# Patient Record
Sex: Female | Born: 1990
Health system: Southern US, Community
[De-identification: ages and names within clinical notes are randomized; demographics above are authoritative.]

## PROBLEM LIST (undated history)

## (undated) DIAGNOSIS — F419 Anxiety disorder, unspecified: Secondary | ICD-10-CM

## (undated) DIAGNOSIS — F909 Attention-deficit hyperactivity disorder, unspecified type: Secondary | ICD-10-CM

## (undated) DIAGNOSIS — Z9889 Other specified postprocedural states: Secondary | ICD-10-CM

## (undated) DIAGNOSIS — F32A Depression, unspecified: Secondary | ICD-10-CM

## (undated) DIAGNOSIS — R87629 Unspecified abnormal cytological findings in specimens from vagina: Secondary | ICD-10-CM

## (undated) HISTORY — DX: Unspecified abnormal cytological findings in specimens from vagina: R87.629

## (undated) HISTORY — PX: CRYOTHERAPY: SHX1416

## (undated) HISTORY — PX: DILATION AND CURETTAGE OF UTERUS: SHX78

---

## 2016-07-20 DIAGNOSIS — O42919 Preterm premature rupture of membranes, unspecified as to length of time between rupture and onset of labor, unspecified trimester: Secondary | ICD-10-CM

## 2017-01-01 DIAGNOSIS — Q999 Chromosomal abnormality, unspecified: Secondary | ICD-10-CM

## 2020-02-08 ENCOUNTER — Other Ambulatory Visit: Payer: Self-pay

## 2020-02-08 ENCOUNTER — Encounter: Payer: Self-pay | Admitting: Psychiatry

## 2020-02-08 ENCOUNTER — Ambulatory Visit (INDEPENDENT_AMBULATORY_CARE_PROVIDER_SITE_OTHER): Payer: 59 | Admitting: Psychiatry

## 2020-02-08 DIAGNOSIS — F900 Attention-deficit hyperactivity disorder, predominantly inattentive type: Secondary | ICD-10-CM | POA: Diagnosis not present

## 2020-02-08 DIAGNOSIS — F411 Generalized anxiety disorder: Secondary | ICD-10-CM | POA: Insufficient documentation

## 2020-02-08 MED ORDER — AMPHETAMINE-DEXTROAMPHET ER 20 MG PO CP24: 20.0000 mg | ORAL_CAPSULE | ORAL | 0 refills | Status: DC

## 2020-02-08 MED ORDER — SERTRALINE HCL 50 MG PO TABS: 50.0000 mg | ORAL_TABLET | Freq: Every day | ORAL | 1 refills | Status: DC
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
## 2020-03-08 ENCOUNTER — Telehealth (INDEPENDENT_AMBULATORY_CARE_PROVIDER_SITE_OTHER): Payer: 59 | Admitting: Psychiatry

## 2020-03-08 ENCOUNTER — Encounter: Payer: Self-pay | Admitting: Psychiatry

## 2020-03-08 ENCOUNTER — Other Ambulatory Visit: Payer: Self-pay

## 2020-03-08 DIAGNOSIS — F411 Generalized anxiety disorder: Secondary | ICD-10-CM | POA: Diagnosis not present

## 2020-03-08 DIAGNOSIS — F900 Attention-deficit hyperactivity disorder, predominantly inattentive type: Secondary | ICD-10-CM | POA: Diagnosis not present

## 2020-03-08 MED ORDER — AMPHETAMINE-DEXTROAMPHET ER 20 MG PO CP24: 20.0000 mg | ORAL_CAPSULE | Freq: Every morning | ORAL | 0 refills | Status: DC

## 2020-03-08 MED ORDER — SERTRALINE HCL 50 MG PO TABS: 50.0000 mg | ORAL_TABLET | Freq: Every day | ORAL | 1 refills | Status: DC

## 2020-03-08 MED ORDER — AMPHETAMINE-DEXTROAMPHET ER 20 MG PO CP24: 20.0000 mg | ORAL_CAPSULE | ORAL | 0 refills | Status: DC

## 2020-03-08 NOTE — Progress Notes (Signed)
BH MD/PA/NP OP Progress Note  I connected with  Ruth Ellis on 03/08/20 by a video enabled telemedicine application and verified that I am speaking with the correct person using two identifiers.   I discussed the limitations of evaluation and management by telemedicine. The patient expressed understanding and agreed to proceed.    03/08/2020 11:00 AM Ruth Ellis  MRN:  [id number]  Chief Complaint:  " I am doing a lot better."  HPI: Patient reported that she has noticed significant difference after she started taking Zoloft.  She stated that her mood is improved.  She is not worrying excessively.  She is not overthinking about things.  She has also noticed improvement in her sleep quality.  She stated that with the help of Adderall she is able to focus on one thing at a time and is able to accomplish a lot of work.  She stated that she started seeing a therapist recently and seeing them 2 times per month and that has been very helpful to.  She also informed that her relationship with her husband has improved and are communicating better.  She has made some changes in her daily routine and also has set boundaries with her mother and that has also helped her think clearly and focus on herself.  She did mention that her mother needed amputation of 2 more toes due to diabetes related complications. She did mention a few instances that happened when she was 67 or 29 years old and stated that she wanted to just bring them up so that she can get some clarity.  Visit Diagnosis:    ICD-10-CM   1. Generalized anxiety disorder  F41.1   2. Attention deficit hyperactivity disorder (ADHD), predominantly inattentive type  F90.0     Past Psychiatric History: Adjustment disorder with anxiety, ADHD  Past Medical History: No past medical history on file. No past surgical history on file.  Family Psychiatric History: History of schizophrenia, bipolar disorder, Alzheimer's disease in the family.  Family  History: No family history on file.  Social History:  Social History   Socioeconomic History  . Marital status: Married    Spouse name: Not on file  . Number of children: Not on file  . Years of education: Not on file  . Highest education level: Not on file  Occupational History  . Not on file  Tobacco Use  . Smoking status: Never Smoker  . Smokeless tobacco: Never Used  Substance and Sexual Activity  . Alcohol use: Not Currently  . Drug use: Never  . Sexual activity: Yes  Other Topics Concern  . Not on file  Social History Narrative  . Not on file   Social Determinants of Health   Financial Resource Strain:   . Difficulty of Paying Living Expenses:   Food Insecurity:   . Worried About Charity fundraiser in the Last Year:   . Arboriculturist in the Last Year:   Transportation Needs:   . Film/video editor (Medical):   Marland Kitchen Lack of Transportation (Non-Medical):   Physical Activity:   . Days of Exercise per Week:   . Minutes of Exercise per Session:   Stress:   . Feeling of Stress :   Social Connections:   . Frequency of Communication with Friends and Family:   . Frequency of Social Gatherings with Friends and Family:   . Attends Religious Services:   . Active Member of Clubs or Organizations:   . Attends Club  or Organization Meetings:   Marland Kitchen Marital Status:     Allergies: Not on File  Metabolic Disorder Labs: No results found for: HGBA1C, MPG No results found for: PROLACTIN No results found for: CHOL, TRIG, HDL, CHOLHDL, VLDL, LDLCALC No results found for: TSH  Therapeutic Level Labs: No results found for: LITHIUM No results found for: VALPROATE No components found for:  CBMZ  Current Medications: Current Outpatient Medications  Medication Sig Dispense Refill  . amphetamine-dextroamphetamine (ADDERALL XR) 20 MG 24 hr capsule Take 1 capsule (20 mg total) by mouth every morning. 30 capsule 0  . sertraline (ZOLOFT) 50 MG tablet Take 1 tablet (50 mg total)  by mouth daily. 30 tablet 1   No current facility-administered medications for this visit.       Psychiatric Specialty Exam: Review of Systems  There were no vitals taken for this visit.There is no height or weight on file to calculate BMI.  General Appearance: Well Groomed  Eye Contact:  Good  Speech:  Clear and Coherent and Normal Rate  Volume:  Normal  Mood:  Euthymic  Affect:  Congruent  Thought Process:  Goal Directed, Linear and Descriptions of Associations: Intact  Orientation:  Full (Time, Place, and Person)  Thought Content: Logical   Suicidal Thoughts:  No  Homicidal Thoughts:  No  Memory:  Recent;   Good Remote;   Good  Judgement: Fair  Insight: Fair  Psychomotor Activity:  Normal  Concentration:  Concentration: Good and Attention Span: Good  Recall:  Good  Fund of Knowledge: Good  Language: Good  Akathisia:  Negative  Handed:  Right  AIMS (if indicated): not done  Assets:  Communication Skills Desire for Improvement Financial Resources/Insurance Housing  ADL's:  Intact  Cognition: WNL  Sleep:  Good, improved    Assessment and Plan: Patient appears to be doing better after starting Zoloft and Adderall XR.  Patient denied any side effects.  She is agreeable to continue same regimen.  1. Generalized anxiety disorder  - sertraline (ZOLOFT) 50 MG tablet; Take 1 tablet (50 mg total) by mouth daily.  Dispense: 30 tablet; Refill: 1  2. Attention deficit hyperactivity disorder (ADHD), predominantly inattentive type  - amphetamine-dextroamphetamine (ADDERALL XR) 20 MG 24 hr capsule; Take 1 capsule (20 mg total) by mouth every morning.  Dispense: 30 capsule; Refill: 0 - amphetamine-dextroamphetamine (ADDERALL XR) 20 MG 24 hr capsule; Take 1 capsule (20 mg total) by mouth in the morning.  Dispense: 30 capsule; Refill: 0  Continue same regimen. Continue individual therapy. Follow-up in 2 months.  Zena Amos, MD 03/08/2020, 11:00 AM

## 2020-05-03 ENCOUNTER — Encounter (HOSPITAL_COMMUNITY): Payer: Self-pay | Admitting: Psychiatry

## 2020-05-03 ENCOUNTER — Telehealth (INDEPENDENT_AMBULATORY_CARE_PROVIDER_SITE_OTHER): Payer: 59 | Admitting: Psychiatry

## 2020-05-03 ENCOUNTER — Other Ambulatory Visit: Payer: Self-pay

## 2020-05-03 ENCOUNTER — Telehealth: Payer: 59 | Admitting: Psychiatry

## 2020-05-03 DIAGNOSIS — Z634 Disappearance and death of family member: Secondary | ICD-10-CM | POA: Diagnosis not present

## 2020-05-03 DIAGNOSIS — F900 Attention-deficit hyperactivity disorder, predominantly inattentive type: Secondary | ICD-10-CM | POA: Diagnosis not present

## 2020-05-03 DIAGNOSIS — F411 Generalized anxiety disorder: Secondary | ICD-10-CM | POA: Diagnosis not present

## 2020-05-03 MED ORDER — AMPHETAMINE-DEXTROAMPHET ER 20 MG PO CP24: 20.0000 mg | ORAL_CAPSULE | Freq: Every morning | ORAL | 0 refills | Status: DC

## 2020-05-03 MED ORDER — AMPHETAMINE-DEXTROAMPHET ER 20 MG PO CP24: 20.0000 mg | ORAL_CAPSULE | ORAL | 0 refills | Status: DC

## 2020-05-03 MED ORDER — SERTRALINE HCL 100 MG PO TABS: 100.0000 mg | ORAL_TABLET | Freq: Every day | ORAL | 2 refills | Status: DC

## 2020-05-03 NOTE — Progress Notes (Signed)
BH MD/PA/NP OP Progress Note  Virtual Visit via Video Note  I connected with Ruth Ellis on 05/03/20 at 10:30 AM EDT by a video enabled telemedicine application and verified that I am speaking with the correct person using two identifiers.  Location: Patient: Home Provider: Clinic   I discussed the limitations of evaluation and management by telemedicine and the availability of in person appointments. The patient expressed understanding and agreed to proceed.  I provided 18 minutes of non-face-to-face time during this encounter.    05/03/2020 10:49 AM Ruth Ellis  MRN:  [id number]  Chief Complaint:  " I lost my mother last week, I am leaving for [place] soon for the funeral arrangements."  HPI: Patient informed that she lost her mother last week.  She informed that her mother had an MI in [place] and died all of a sudden.  Patient stated that prior to losing her mother she was doing really well in terms of her mental and physical health.  She had been working out regularly and had lost weight.  She had started building up a new friend base and was outgoing.  She was working on fitness and had picked up new interests and hobbies.  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 She informed that she had even mended her differences with her mother a few weeks before she passed away.  She stated that they had started to communicate better.  She informed only that was not going well was her relationship with her husband as her husband is showing all the active signs of depression that she herself had dealt with.  She stated that her husband does not want to get any medications to help with that therefore she had encouraged him to see a therapist.  He missed his first evaluation with the therapist due to side effects to the second dose of COVID-19 vaccination.  However ever since her mother  passed away her husband has stepped up and has been very supportive of her.  He is very helpful now and is doing everything he can to help the patient.  Patient stated that she is grieving the loss and has been feeling very depressed and irritable.  She requested for an increase in her sertraline.  She stated that she has been sleeping excessively as a way to cope because she does not have any energy to do anything else.  She was agreeable to going up on the dose of sertraline to 100 mg daily.  She denied any suicidal ideations.   Visit Diagnosis:    ICD-10-CM   1. Generalized anxiety disorder  F41.1   2. Attention deficit hyperactivity disorder (ADHD), predominantly inattentive type  F90.0   3. Bereavement  Z63.4     Past Psychiatric History: Adjustment disorder with anxiety, ADHD  Past Medical History: No past medical history on file. No past surgical history on file.  Family Psychiatric History: History of schizophrenia, bipolar disorder, Alzheimer's disease in the family.  Family History: No family history on file.  Social History:  Social History   Socioeconomic History  . Marital status: Married    Spouse name: Not on file  . Number of children: Not on file  . Years of education: Not on file  . Highest education level: Not on file  Occupational History  . Not on file  Tobacco Use  . Smoking status: Never  Smoker  . Smokeless tobacco: Never Used  Substance and Sexual Activity  . Alcohol use: Not Currently  . Drug use: Never  . Sexual activity: Yes  Other Topics Concern  . Not on file  Social History Narrative  . Not on file   Social Determinants of Health   Financial Resource Strain:   . Difficulty of Paying Living Expenses:   Food Insecurity:   . Worried About Programme researcher, broadcasting/film/video in the Last Year:   . Barista in the Last Year:   Transportation Needs:   . Freight forwarder (Medical):   Marland Kitchen Lack of Transportation (Non-Medical):   Physical Activity:    . Days of Exercise per Week:   . Minutes of Exercise per Session:   Stress:   . Feeling of Stress :   Social Connections:   . Frequency of Communication with Friends and Family:   . Frequency of Social Gatherings with Friends and Family:   . Attends Religious Services:   . Active Member of Clubs or Organizations:   . Attends Banker Meetings:   Marland Kitchen Marital Status:     Allergies: Not on File  Metabolic Disorder Labs: No results found for: HGBA1C, MPG No results found for: PROLACTIN No results found for: CHOL, TRIG, HDL, CHOLHDL, VLDL, LDLCALC No results found for: TSH  Therapeutic Level Labs: No results found for: LITHIUM No results found for: VALPROATE No components found for:  CBMZ  Current Medications: Current Outpatient Medications  Medication Sig Dispense Refill  . amphetamine-dextroamphetamine (ADDERALL XR) 20 MG 24 hr capsule Take 1 capsule (20 mg total) by mouth every morning. 30 capsule 0  . amphetamine-dextroamphetamine (ADDERALL XR) 20 MG 24 hr capsule Take 1 capsule (20 mg total) by mouth in the morning. 30 capsule 0  . sertraline (ZOLOFT) 50 MG tablet Take 1 tablet (50 mg total) by mouth daily. 30 tablet 1   No current facility-administered medications for this visit.       Psychiatric Specialty Exam: Review of Systems  There were no vitals taken for this visit.There is no height or weight on file to calculate BMI.  General Appearance: Well Groomed  Eye Contact:  Good  Speech:  Clear and Coherent and Normal Rate  Volume:  Normal  Mood: Sad, irritable  Affect: Sad  Thought Process:  Goal Directed, Linear and Descriptions of Associations: Intact  Orientation:  Full (Time, Place, and Person)  Thought Content: Logical   Suicidal Thoughts:  No  Homicidal Thoughts:  No  Memory:  Recent;   Good Remote;   Good  Judgement: Fair  Insight: Fair  Psychomotor Activity:  Normal  Concentration:  Concentration: Good and Attention Span: Good   Recall:  Good  Fund of Knowledge: Good  Language: Good  Akathisia:  Negative  Handed:  Right  AIMS (if indicated): not done  Assets:  Communication Skills Desire for Improvement Financial Resources/Insurance Housing  ADL's:  Intact  Cognition: WNL  Sleep:  Good, improved    Assessment and Plan: Patient recently lost her mother suddenly and is now arranging for her funeral which is scheduled for next week.  She stated that she is feeling really depressed with low energy and sleeping excessively.  She requested her dose of sertraline to be increased as it has been helpful in the past.  She reported her dose of Adderall XR is working fine for now.  1. Generalized anxiety disorder - Increase sertraline (ZOLOFT) 100 MG tablet; Take  1 tablet (100 mg total) by mouth daily.  Dispense: 30 tablet; Refill: 2  2. Attention deficit hyperactivity disorder (ADHD), predominantly inattentive type  - amphetamine-dextroamphetamine (ADDERALL XR) 20 MG 24 hr capsule; Take 1 capsule (20 mg total) by mouth every morning.  Dispense: 30 capsule; Refill: 0 - amphetamine-dextroamphetamine (ADDERALL XR) 20 MG 24 hr capsule; Take 1 capsule (20 mg total) by mouth in the morning.  Dispense: 30 capsule; Refill: 0  3. Bereavement   Continue individual therapy. Follow-up in 2 months.  Nev[place] Crane, MD 05/03/2020, 10:49 AM

## 2020-07-03 ENCOUNTER — Encounter (HOSPITAL_COMMUNITY): Payer: Self-pay | Admitting: Psychiatry

## 2020-07-03 ENCOUNTER — Telehealth (INDEPENDENT_AMBULATORY_CARE_PROVIDER_SITE_OTHER): Payer: 59 | Admitting: Psychiatry

## 2020-07-03 ENCOUNTER — Other Ambulatory Visit: Payer: Self-pay

## 2020-07-03 DIAGNOSIS — Z634 Disappearance and death of family member: Secondary | ICD-10-CM | POA: Diagnosis not present

## 2020-07-03 DIAGNOSIS — F411 Generalized anxiety disorder: Secondary | ICD-10-CM

## 2020-07-03 DIAGNOSIS — F900 Attention-deficit hyperactivity disorder, predominantly inattentive type: Secondary | ICD-10-CM | POA: Diagnosis not present

## 2020-07-03 MED ORDER — AMPHETAMINE-DEXTROAMPHET ER 20 MG PO CP24: 20.0000 mg | ORAL_CAPSULE | ORAL | 0 refills | Status: DC

## 2020-07-03 MED ORDER — SERTRALINE HCL 100 MG PO TABS: 100.0000 mg | ORAL_TABLET | Freq: Every day | ORAL | 0 refills | Status: DC

## 2020-07-03 NOTE — Progress Notes (Signed)
BH MD/PA/NP OP Progress Note  Virtual Visit via Video Note  I connected with Ruth Ellis on 07/03/20 at  4:10 PM EDT by a video enabled telemedicine application and verified that I am speaking with the correct person using two identifiers.  Location: Patient: Home Provider: Clinic   I discussed the limitations of evaluation and management by telemedicine and the availability of in person appointments. The patient expressed understanding and agreed to proceed.  I provided 16 minutes of non-face-to-face time during this encounter.    07/03/2020 4:23 PM Ruth Ellis  MRN:  [id number]  Chief Complaint:  " I am doing pretty good."  HPI: Patient informed that she is still in [place].  She stated that she has decided to separate from her husband as she is planning to stay in [place].  She is out of work temporarily and has found a part-time job where.  She plans to return back to her full-time job soon.  She stated that she still talking to her husband as they are doing online virtual marital counseling which is helpful.  However she has decided that separating from him would be the best solution for her as she was not really happy with him.  She stated that she is planning to find a new psychiatrist locally in the near future.  She requested for refills to help her out this time.  She denied any other concerns or issues at this time.    Visit Diagnosis:    ICD-10-CM   1. Generalized anxiety disorder  F41.1   2. Attention deficit hyperactivity disorder (ADHD), predominantly inattentive type  F90.0   3. Bereavement  Z63.4     Past Psychiatric History: Adjustment disorder with anxiety, ADHD  Past Medical History: No past medical history on file. No past surgical history on file.  Family Psychiatric History: History of schizophrenia, bipolar disorder, Alzheimer's disease in the family.  Family History: No family history on file.  Social History:  Social History    Socioeconomic History  . Marital status: Married    Spouse name: Not on file  . Number of children: Not on file  . Years of education: Not on file  . Highest education level: Not on file  Occupational History  . Not on file  Tobacco Use  . Smoking status: Never Smoker  . Smokeless tobacco: Never Used  Substance and Sexual Activity  . Alcohol use: Not Currently  . Drug use: Never  . Sexual activity: Yes  Other Topics Concern  . Not on file  Social History Narrative  . Not on file   Social Determinants of Health   Financial Resource Strain:   . Difficulty of Paying Living Expenses: Not on file  Food Insecurity:   . Worried About Programme researcher, broadcasting/film/video in the Last Year: Not on file  . Ran Out of Food in the Last Year: Not on file  Transportation Needs:   . Lack of Transportation (Medical): Not on file  . Lack of Transportation (Non-Medical): Not on file  Physical Activity:   . Days of Exercise per Week: Not on file  . Minutes of Exercise per Session: Not on file  Stress:   . Feeling of Stress : Not on file  Social Connections:   . Frequency of Communication with Friends and Family: Not on file  . Frequency of Social Gatherings with Friends and Family: Not on file  . Attends Religious Services: Not on file  . Active Member of  Clubs or Organizations: Not on file  . Attends Banker Meetings: Not on file  . Marital Status: Not on file    Allergies: Not on File  Metabolic Disorder Labs: No results found for: HGBA1C, MPG No results found for: PROLACTIN No results found for: CHOL, TRIG, HDL, CHOLHDL, VLDL, LDLCALC No results found for: TSH  Therapeutic Level Labs: No results found for: LITHIUM No results found for: VALPROATE No components found for:  CBMZ  Current Medications: Current Outpatient Medications  Medication Sig Dispense Refill  . amphetamine-dextroamphetamine (ADDERALL XR) 20 MG 24 hr capsule Take 1 capsule (20 mg total) by mouth every  morning. 30 capsule 0  . amphetamine-dextroamphetamine (ADDERALL XR) 20 MG 24 hr capsule Take 1 capsule (20 mg total) by mouth in the morning. 30 capsule 0  . sertraline (ZOLOFT) 100 MG tablet Take 1 tablet (100 mg total) by mouth daily. 30 tablet 2   No current facility-administered medications for this visit.       Psychiatric Specialty Exam: Review of Systems  There were no vitals taken for this visit.There is no height or weight on file to calculate BMI.  General Appearance: Well Groomed  Eye Contact:  Good  Speech:  Clear and Coherent and Normal Rate  Volume:  Normal  Mood:Euthymic  Affect: Congruent  Thought Process:  Goal Directed, Linear and Descriptions of Associations: Intact  Orientation:  Full (Time, Place, and Person)  Thought Content: Logical   Suicidal Thoughts:  No  Homicidal Thoughts:  No  Memory:  Recent;   Good Remote;   Good  Judgement: Fair  Insight: Fair  Psychomotor Activity:  Normal  Concentration:  Concentration: Good and Attention Span: Good  Recall:  Good  Fund of Knowledge: Good  Language: Good  Akathisia:  Negative  Handed:  Right  AIMS (if indicated): not done  Assets:  Communication Skills Desire for Improvement Financial Resources/Insurance Housing  ADL's:  Intact  Cognition: WNL  Sleep:  Good, improved    Assessment and Plan: Patient appears to be doing better.  She is planning to stay in [place] permanently requested one-time refill for medications until she finds a new provider.  1. Generalized anxiety disorder - Continue sertraline (ZOLOFT) 100 MG tablet; Take 1 tablet (100 mg total) by mouth daily.  Dispense: 30 tablet; Refill: 2  2. Attention deficit hyperactivity disorder (ADHD), predominantly inattentive type  - amphetamine-dextroamphetamine (ADDERALL XR) 20 MG 24 hr capsule; Take 1 capsule (20 mg total) by mouth every morning.  Dispense: 30 capsule; Refill: 0 - amphetamine-dextroamphetamine (ADDERALL XR) 20 MG 24 hr  capsule; Take 1 capsule (20 mg total) by mouth in the morning.  Dispense: 30 capsule; Refill: 0  3. Bereavement   Continue individual and marital therapy. Pt moved to [place], plans to find a psychiatrist there.  Zena Amos, MD 07/03/2020, 4:23 PM

## 2020-08-07 ENCOUNTER — Other Ambulatory Visit (HOSPITAL_COMMUNITY): Payer: Self-pay | Admitting: Psychiatry

## 2020-08-07 DIAGNOSIS — F411 Generalized anxiety disorder: Secondary | ICD-10-CM

## 2020-11-11 NOTE — L&D Delivery Note (Addendum)
OB/GYN Faculty Practice Delivery Note  Ruth Ellis is a 30 y.o. [id number] s/p NSVD at [redacted]w[redacted]d. She was admitted for SROM.   ROM: SROM with clear fluid GBS Status: Negative Maximum Maternal Temperature: 99.8  Labor Progress: Patient presented after SROM, progressed from 1.5 cm to fully  Delivery Date/Time: 07/21/2021 at 1020 Delivery: Called to room and patient was complete and pushing. Head delivered LOA with a compound hand presentation. No nuchal cord present. Shoulder and body delivered in usual fashion. Infant with spontaneous cry, placed on mother's abdomen, dried and stimulated. Cord clamped x 2 after 1-minute delay, and cut by father of baby. Cord blood drawn. Placenta delivered spontaneously with gentle cord traction. Fundus firm with massage and Pitocin.   A post placental Liletta was then placed (see separate procedure note below)  Labia, perineum, vagina, and cervix inspected and found to have a third degree laceration (3a) which was repaired by Dr. Shawnie Pons with a 3-0 vicryl along with a superficial rectal mucosal extension which was repaired with a 4-0 vicryl. The remainder of the laceration was repaired by Dr. Ephriam Jenkins using a 3-0 vicryl.  Placenta: intact, 3v cord, sent to L&D Complications:  Lacerations: 3a laceration with superficial rectal mucosal extension.  EBL: 250cc Analgesia: Epidural   Infant: female  APGARs 8,9  3370g   Post-Placental IUD Insertion Procedure Note  Patient identified, informed consent signed prior to delivery, signed copy in chart, time out was performed.    Vaginal, labial and perineal areas thoroughly inspected for lacerations. 3rd degree laceration identified which was hemostatic and not repaired prior to insertion of IUD.    - Liletta IUD grasped between sterile gloved fingers. Sterile lubrication applied to sterile gloved hand for ease of insertion. Fundus identified through abdominal wall using non-insertion hand. IUD inserted to fundus with  bimanual technique. IUD carefully released at the fundus and insertion hand gently removed from vagina.    Strings trimmed to the level of the introitus. Patient tolerated procedure well.   Patient given post procedure instructions and IUD care card with expiration date.  Patient is asked to keep IUD strings tucked in her vagina until her postpartum follow up visit in 4-6 weeks. Patient advised to abstain from sexual intercourse and pulling on strings before her follow-up visit. Patient verbalized an understanding of the plan of care and agrees.   [name], MD, MPH OB Fellow, [facility] for [facility], [facility] 07/21/2021, 8:49 PM

## 2020-12-17 ENCOUNTER — Other Ambulatory Visit: Payer: Self-pay

## 2020-12-17 ENCOUNTER — Encounter (HOSPITAL_COMMUNITY): Payer: Self-pay

## 2020-12-17 ENCOUNTER — Inpatient Hospital Stay (HOSPITAL_COMMUNITY): Payer: Medicaid Other

## 2020-12-17 ENCOUNTER — Inpatient Hospital Stay (HOSPITAL_COMMUNITY)
Admission: AD | Admit: 2020-12-17 | Discharge: 2020-12-17 | Disposition: A | Discharge status: Home / Self Care | Payer: Medicaid Other | Attending: Emergency Medicine | Admitting: Emergency Medicine

## 2020-12-17 DIAGNOSIS — M545 Low back pain, unspecified: Secondary | ICD-10-CM | POA: Insufficient documentation

## 2020-12-17 DIAGNOSIS — Z3A01 Less than 8 weeks gestation of pregnancy: Secondary | ICD-10-CM | POA: Diagnosis not present

## 2020-12-17 DIAGNOSIS — O26851 Spotting complicating pregnancy, first trimester: Secondary | ICD-10-CM | POA: Insufficient documentation

## 2020-12-17 DIAGNOSIS — N96 Recurrent pregnancy loss: Secondary | ICD-10-CM | POA: Insufficient documentation

## 2020-12-17 DIAGNOSIS — Z8759 Personal history of other complications of pregnancy, childbirth and the puerperium: Secondary | ICD-10-CM | POA: Diagnosis not present

## 2020-12-17 DIAGNOSIS — O209 Hemorrhage in early pregnancy, unspecified: Secondary | ICD-10-CM

## 2020-12-17 DIAGNOSIS — Z3491 Encounter for supervision of normal pregnancy, unspecified, first trimester: Secondary | ICD-10-CM

## 2020-12-17 HISTORY — DX: Attention-deficit hyperactivity disorder, unspecified type: F90.9

## 2020-12-17 HISTORY — DX: Depression, unspecified: F32.A

## 2020-12-17 HISTORY — DX: Anxiety disorder, unspecified: F41.9

## 2020-12-17 LAB — HCG, QUANTITATIVE, PREGNANCY: hCG, Beta Chain, Quant, S: 70442 m[IU]/mL — ABNORMAL HIGH (ref <5)

## 2020-12-17 LAB — URINALYSIS, ROUTINE W REFLEX MICROSCOPIC
Bilirubin Urine: NEGATIVE
Glucose, UA: NEGATIVE mg/dL
Ketones, ur: NEGATIVE mg/dL
Leukocytes,Ua: NEGATIVE
Nitrite: NEGATIVE
Protein, ur: NEGATIVE mg/dL
Specific Gravity, Urine: 1.005 (ref 1.005–1.030)
pH: 8 (ref 5.0–8.0)

## 2020-12-17 LAB — WET PREP, GENITAL
Sperm: NONE SEEN
Trich, Wet Prep: NONE SEEN
Yeast Wet Prep HPF POC: NONE SEEN

## 2020-12-17 LAB — CBC
HCT: 37.7 % (ref 36.0–46.0)
Hemoglobin: 12.2 g/dL (ref 12.0–15.0)
MCH: 28.2 pg (ref 26.0–34.0)
MCHC: 32.4 g/dL (ref 30.0–36.0)
MCV: 87.3 fL (ref 80.0–100.0)
Platelets: 374 10*3/uL (ref 150–400)
RBC: 4.32 MIL/uL (ref 3.87–5.11)
RDW: 12.1 % (ref 11.5–15.5)
WBC: 10.3 10*3/uL (ref 4.0–10.5)
nRBC: 0 % (ref 0.0–0.2)

## 2020-12-17 LAB — I-STAT BETA HCG BLOOD, ED (MC, WL, AP ONLY): I-stat hCG, quantitative: >2000 m[IU]/mL — ABNORMAL HIGH (ref <5)

## 2020-12-17 LAB — ABO/RH: ABO/RH(D): O POS

## 2020-12-17 LAB — HIV ANTIBODY (ROUTINE TESTING W REFLEX): HIV Screen 4th Generation wRfx: NONREACTIVE

## 2020-12-17 NOTE — ED Triage Notes (Signed)
Patient reports that she is [redacted] weeks pregnant and is having light vaginal bleeding all weekend. Reports back pain with slight cramping. Has had previous pregnancies with miscarrigae

## 2020-12-17 NOTE — Progress Notes (Signed)
Wet prep & GC/Chlamydia cultures obtained by LL. Leftwich-Kirby, CNM

## 2020-12-17 NOTE — ED Provider Notes (Signed)
Pt is [redacted] weeks pregnant.  Pt reports she is having vaginal spotting.  Pt reports she has a history of multiple miscarriage.   Vitals stable  pregancy positive I spoke to [place] provider.   Pt transferred to [facility].     Elson Areas, PA-C 12/17/20 1500    Gwyneth Sprout, MD 12/17/20 2051

## 2020-12-17 NOTE — MAU Provider Note (Signed)
Chief Complaint: Vaginal Bleeding   Event Date/Time   First Provider Initiated Contact with Patient 12/17/20 1811       SUBJECTIVE HPI: Ruth Ellis is a 30 y.o. [id number] at 21w6dby LMP who presents to maternity admissions reporting vaginal spotting x 2 days.  There is no pain, there are no other symptoms. She has hx miscarriage with 18 week and 10 week losses with previous pregnancies.  She has not tried any treatments.  .Marland Kitchen HPI  Past Medical History:  Diagnosis Date  . ADHD   . Anxiety   . Depression    Past Surgical History:  Procedure Laterality Date  . CRYOTHERAPY     Social History   Socioeconomic History  . Marital status: Married    Spouse name: Not on file  . Number of children: Not on file  . Years of education: Not on file  . Highest education level: Not on file  Occupational History  . Not on file  Tobacco Use  . Smoking status: Never Smoker  . Smokeless tobacco: Never Used  Vaping Use  . Vaping Use: Never used  Substance and Sexual Activity  . Alcohol use: Not Currently    Comment: Social  . Drug use: Never  . Sexual activity: Yes  Other Topics Concern  . Not on file  Social History Narrative  . Not on file   Social Determinants of Health   Financial Resource Strain: Not on file  Food Insecurity: Not on file  Transportation Needs: Not on file  Physical Activity: Not on file  Stress: Not on file  Social Connections: Not on file  Intimate Partner Violence: Not on file   No current facility-administered medications on file prior to encounter.   Current Outpatient Medications on File Prior to Encounter  Medication Sig Dispense Refill  . amphetamine-dextroamphetamine (ADDERALL XR) 20 MG 24 hr capsule Take 1 capsule (20 mg total) by mouth in the morning. 30 capsule 0  . amphetamine-dextroamphetamine (ADDERALL XR) 20 MG 24 hr capsule Take 1 capsule (20 mg total) by mouth every morning. 30 capsule 0  . sertraline (ZOLOFT) 100 MG tablet TAKE 1  TABLET(100 MG) BY MOUTH DAILY 30 tablet 1   Allergies  Allergen Reactions  . Kiwi Extract Other (See Comments)    Fruit -  Throat itches    ROS:  Review of Systems  Constitutional: Negative for chills, fatigue and fever.  Respiratory: Negative for shortness of breath.   Cardiovascular: Negative for chest pain.  Gastrointestinal: Negative for abdominal pain, nausea and vomiting.  Genitourinary: Positive for vaginal bleeding. Negative for difficulty urinating, dysuria, flank pain, pelvic pain, vaginal discharge and vaginal pain.  Neurological: Negative for dizziness and headaches.  Psychiatric/Behavioral: Negative.      I have reviewed patient's Past Medical Hx, Surgical Hx, Family Hx, Social Hx, medications and allergies.   Physical Exam   Patient Vitals for the past 24 hrs:  BP Temp Temp src Pulse Resp SpO2 Height Weight  12/17/20 1553 114/62 98.4 F (36.9 C) Oral 71 20 100 % -- --  12/17/20 1542 -- -- -- -- -- -- _0  (1.651 m) 72.1 kg   Constitutional: Well-developed, well-nourished female in no acute distress.  Cardiovascular: normal rate Respiratory: normal effort GI: Abd soft, non-tender. Pos BS x 4 MS: Extremities nontender, no edema, normal ROM Neurologic: Alert and oriented x 4.  GU: Neg CVAT.  PELVIC EXAM: Vaginal cultures collected by blind swab   LAB RESULTS Results for orders  placed or performed during the hospital encounter of 12/17/20 (from the past 24 hour(s))  I-Stat beta hCG blood, ED     Status: Abnormal   Collection Time: 12/17/20  2:13 PM  Result Value Ref Range   I-stat hCG, quantitative >2,000.0 (H) <5 mIU/mL   Comment 3          hCG, quantitative, pregnancy     Status: Abnormal   Collection Time: 12/17/20  3:29 PM  Result Value Ref Range   hCG, Beta Chain, Quant, S 70,442 (H) <5 mIU/mL  CBC     Status: None   Collection Time: 12/17/20  3:29 PM  Result Value Ref Range   WBC 10.3 4.0 - 10.5 K/uL   RBC 4.32 3.87 - 5.11 MIL/uL   Hemoglobin  12.2 12.0 - 15.0 g/dL   HCT 37.7 36.0 - 46.0 %   MCV 87.3 80.0 - 100.0 fL   MCH 28.2 26.0 - 34.0 pg   MCHC 32.4 30.0 - 36.0 g/dL   RDW 12.1 11.5 - 15.5 %   Platelets 374 150 - 400 K/uL   nRBC 0.0 0.0 - 0.2 %  ABO/Rh     Status: None   Collection Time: 12/17/20  3:29 PM  Result Value Ref Range   ABO/RH(D) O POS    No rh immune globuloin      NOT A RH IMMUNE GLOBULIN CANDIDATE, PT RH POSITIVE Performed at [facility], [place] [address], [place], [place] [address]   HIV Antibody (routine testing w rflx)     Status: None   Collection Time: 12/17/20  3:29 PM  Result Value Ref Range   HIV Screen 4th Generation wRfx Non Reactive Non Reactive  Urinalysis, Routine w reflex microscopic Urine, Clean Catch     Status: Abnormal   Collection Time: 12/17/20  5:47 PM  Result Value Ref Range   Color, Urine YELLOW YELLOW   APPearance HAZY (A) CLEAR   Specific Gravity, Urine 1.005 1.005 - 1.030   pH 8.0 5.0 - 8.0   Glucose, UA NEGATIVE NEGATIVE mg/dL   Hgb urine dipstick SMALL (A) NEGATIVE   Bilirubin Urine NEGATIVE NEGATIVE   Ketones, ur NEGATIVE NEGATIVE mg/dL   Protein, ur NEGATIVE NEGATIVE mg/dL   Nitrite NEGATIVE NEGATIVE   Leukocytes,Ua NEGATIVE NEGATIVE   RBC / HPF 0-5 0 - 5 RBC/hpf   WBC, UA 0-5 0 - 5 WBC/hpf   Bacteria, UA RARE (A) NONE SEEN   Squamous Epithelial / LPF 11-20 0 - 5  Wet prep, genital     Status: Abnormal   Collection Time: 12/17/20  5:55 PM   Specimen: PATH Cytology Cervicovaginal Ancillary Only  Result Value Ref Range   Yeast Wet Prep HPF POC NONE SEEN NONE SEEN   Trich, Wet Prep NONE SEEN NONE SEEN   Clue Cells Wet Prep HPF POC PRESENT (A) NONE SEEN   WBC, Wet Prep HPF POC MODERATE (A) NONE SEEN   Sperm NONE SEEN     --/--/O POS (02/06 1529)  IMAGING  US OB LESS THAN 14 WEEKS WITH OB TRANSVAGINAL  Result Date: 12/17/2020 CLINICAL DATA:  Vaginal bleeding EXAM: OBSTETRIC <14 WK [place] AND TRANSVAGINAL OB US TECHNIQUE: Both transabdominal and transvaginal  ultrasound examinations were performed for complete evaluation of the gestation as well as the maternal uterus, adnexal regions, and pelvic cul-de-sac. Transvaginal technique was performed to assess early pregnancy. COMPARISON:  None. FINDINGS: Intrauterine gestational sac: Single Yolk sac:  Visualized. Embryo:  Visualized. Cardiac  Activity: Visualized. Heart Rate: 120 bpm MSD:   mm    w     d CRL:  6.7 mm   6 w   3 d                  [place] EDC: 08/09/2021 Subchorionic hemorrhage:  None visualized. Maternal uterus/adnexae: No adnexal mass or free fluid. IMPRESSION: Six week 3 day intrauterine pregnancy. Fetal heart rate 120 beats per minute. No acute maternal findings. Electronically Signed   By: Rolm Baptise M.D.   On: 12/17/2020 19:01   MAU Management/MDM: Orders Placed This Encounter  Procedures  . Wet prep, genital  . US OB LESS THAN 14 WEEKS WITH OB TRANSVAGINAL  . Urinalysis, Routine w reflex microscopic Urine, Clean Catch  . hCG, quantitative, pregnancy  . CBC  . HIV Antibody (routine testing w rflx)  . I-Stat beta hCG blood, ED  . ABO/Rh    No orders of the defined types were placed in this encounter.   IUP confirmed by today's [place].  Wet prep with clue cells, but no other criteria met for BV.  Pt to f/u with early prenatal care, given list of providers.  Bleeding precautions/reasons to return to MAU reviewed.   ASSESSMENT 1. Normal IUP (intrauterine pregnancy) on prenatal ultrasound, first trimester   2. Vaginal bleeding in pregnancy, first trimester   3. Spotting affecting pregnancy in first trimester     PLAN Discharge home Allergies as of 12/17/2020      Reactions   Kiwi Extract Other (See Comments)   Fruit -  Throat itches      Medication List    STOP taking these medications   amphetamine-dextroamphetamine 20 MG 24 hr capsule Commonly known as: ADDERALL XR     TAKE these medications   sertraline 100 MG tablet Commonly known as: ZOLOFT TAKE 1 TABLET(100 MG) BY MOUTH  DAILY        Fatima Blank Certified Nurse-Midwife 12/17/2020  6:24 PM

## 2020-12-17 NOTE — ED Notes (Signed)
Clydie Braun, PA at triage for [facility].  Reports called to MAU.

## 2020-12-17 NOTE — MAU Note (Signed)
Presents with c/o VB, reports doesn't saturate panty liner. Has Hx of miscarriages and wants make sure everything is ok.  Reports mild abdominal cramping & back pain.  LMP 10/31/2021.

## 2020-12-17 NOTE — Discharge Instructions (Signed)
Prenatal Care Providers           Center for [facility] @ [facility]  [address] [phone]  Center for [facility] @ [facility]   [address]  [phone]  Center for [facility] @ [facility]  [address] [phone]  Center For [facility] @ [facility]       [address] [phone]            Center for [facility] @ [place]     [phone]          Center for [facility] @ [facility]   [address] [phone]      Center for [facility] @ [address] [place])  [address]   [phone]     [facility]  Phone: [phone]  [facility]  Phone: [phone]  [facility] Phone: [phone]  [facility] Phone: [phone]  [facility] Phone: [phone]  [facility] Phone: [phone]  [facility]  Phone: [phone]

## 2020-12-18 LAB — GC/CHLAMYDIA PROBE AMP (~~LOC~~) NOT AT ARMC
Chlamydia: NEGATIVE
Comment: NEGATIVE
Comment: NORMAL
Neisseria Gonorrhea: NEGATIVE

## 2021-02-15 ENCOUNTER — Other Ambulatory Visit: Payer: Self-pay

## 2021-02-15 ENCOUNTER — Other Ambulatory Visit (HOSPITAL_COMMUNITY)
Admission: RE | Admit: 2021-02-15 | Discharge: 2021-02-15 | Disposition: A | Discharge status: Home / Self Care | Payer: Medicaid Other | Source: Ambulatory Visit | Attending: Obstetrics and Gynecology | Admitting: Obstetrics and Gynecology

## 2021-02-15 ENCOUNTER — Ambulatory Visit (INDEPENDENT_AMBULATORY_CARE_PROVIDER_SITE_OTHER): Payer: Medicaid Other | Admitting: Obstetrics and Gynecology

## 2021-02-15 ENCOUNTER — Encounter: Payer: Self-pay | Admitting: Obstetrics and Gynecology

## 2021-02-15 VITALS — BP 132/74 | HR 91 | Wt 162.0 lb

## 2021-02-15 DIAGNOSIS — O26892 Other specified pregnancy related conditions, second trimester: Secondary | ICD-10-CM | POA: Diagnosis not present

## 2021-02-15 DIAGNOSIS — Z3482 Encounter for supervision of other normal pregnancy, second trimester: Secondary | ICD-10-CM

## 2021-02-15 DIAGNOSIS — O3432 Maternal care for cervical incompetence, second trimester: Secondary | ICD-10-CM | POA: Diagnosis not present

## 2021-02-15 DIAGNOSIS — O0990 Supervision of high risk pregnancy, unspecified, unspecified trimester: Secondary | ICD-10-CM | POA: Insufficient documentation

## 2021-02-15 DIAGNOSIS — N898 Other specified noninflammatory disorders of vagina: Secondary | ICD-10-CM

## 2021-02-15 DIAGNOSIS — Z3A15 15 weeks gestation of pregnancy: Secondary | ICD-10-CM | POA: Diagnosis not present

## 2021-02-15 DIAGNOSIS — O0992 Supervision of high risk pregnancy, unspecified, second trimester: Secondary | ICD-10-CM

## 2021-02-15 DIAGNOSIS — O26899 Other specified pregnancy related conditions, unspecified trimester: Secondary | ICD-10-CM

## 2021-02-15 DIAGNOSIS — Z9889 Other specified postprocedural states: Secondary | ICD-10-CM | POA: Diagnosis not present

## 2021-02-15 HISTORY — DX: Supervision of high risk pregnancy, unspecified, unspecified trimester: O09.90

## 2021-02-15 NOTE — Progress Notes (Signed)
Pt presents today for NOB visit. Pt c/o back pain and HA's. Pt has not tried tylenol for either one. Pt states she has tried stretching for the back pain which does help. She has noticed some painful intercourse. PHQ Score 11

## 2021-02-15 NOTE — Progress Notes (Signed)
History:   Ruth Ellis is a 30 y.o. G6P0050 at [redacted]w[redacted]d by certain LMP being seen today for her first obstetrical visit. She recently moved to [facility].  Her obstetrical history is significant for poor ob history. Cervical insufficiency, PPROM.   Patient does intend to breast feed. Pregnancy history fully reviewed. Planned pregnancy, her and husband are excited and nervous d/t history.   Strong family history of DM- last A1c was normal.  Poor OB history- history of cervical insufficiency. Starting care a little bit late d/t move from [place].  Patient reports white vaginal discharge, concerned about yeast.   HISTORY: OB History  Gravida Para Term Preterm AB Living  6 0 0 0 5 0  SAB IAB Ectopic Multiple Live Births  3 2 0 0 0    # Outcome Date GA Lbr Len/2nd Weight Sex Delivery Anes PTL Lv  6 Current           5 SAB 01/01/17 [redacted]w[redacted]d            Complications: Chromosomal abnormality  4 SAB 07/20/16 [redacted]w[redacted]d   F    ND     Complications: Preterm premature rupture of membranes (PPROM) with unknown onset of labor  3 SAB 08/01/15 [redacted]w[redacted]d         2 IAB  [redacted]w[redacted]d            Complications: History of D&C  1 IAB             Obstetric Comments  PPROM/Cervical insufficiency    Last pap smear was done 2020 and was normal- Hx of Cryo   Past Medical History:  Diagnosis Date  . ADHD   . Anxiety   . Depression    Past Surgical History:  Procedure Laterality Date  . CRYOTHERAPY     Family History  Problem Relation Age of Onset  . Diabetes Mother   . Heart disease Mother   . Renal Disease Mother   . Diabetes Father    Social History   Tobacco Use  . Smoking status: Never Smoker  . Smokeless tobacco: Never Used  Vaping Use  . Vaping Use: Never used  Substance Use Topics  . Alcohol use: Not Currently    Comment: Social  . Drug use: Never   Allergies  Allergen Reactions  . Kiwi Extract Other (See Comments)    Fruit -  Throat itches   Current Outpatient Medications on File Prior to  Visit  Medication Sig Dispense Refill  . Prenatal Vit-Fe Fumarate-FA (PRENATAL VITAMINS) 28-0.8 MG TABS Take 1 tablet by mouth daily.    . progesterone (PROMETRIUM) 200 MG capsule Place 200 mg vaginally daily. (Patient not taking: Reported on 02/15/2021)     No current facility-administered medications on file prior to visit.    Review of Systems Pertinent items noted in HPI and remainder of comprehensive ROS otherwise negative.  Physical Exam:   Vitals:   02/15/21 1433  BP: 132/74  Pulse: 91  Weight: 162 lb (73.5 kg)      FHR check: + fetal heart tones.  General: well-developed, well-nourished female in no acute distress  Breasts:  normal appearance, no masses or tenderness bilaterally  Skin: normal coloration and turgor, no rashes  Neurologic: oriented, normal, negative, normal mood  Extremities: normal strength, tone, and muscle mass, ROM of all joints is normal  HEENT PERRLA, extraocular movement intact and sclera clear, anicteric  Neck supple and no masses  Cardiovascular: regular rate and rhythm  Respiratory:  no respiratory distress, normal breath sounds  Abdomen: soft, non-tender; bowel sounds normal; no masses,  no organomegaly  Pelvic: normal external genitalia, no lesions, normal vaginal mucosa, normal vaginal discharge, normal cervix, pap smear not done done. Cervix closed.     Assessment:    Pregnancy: [id number] Patient Active Problem List   Diagnosis Date Noted  . Cervical insufficiency during pregnancy in second trimester, antepartum 02/15/2021  . History of cryosurgery 02/15/2021  . History of preterm premature rupture of membranes (PPROM) 12/17/2020  . Bereavement 05/03/2020  . Generalized anxiety disorder 02/08/2020  . Attention deficit hyperactivity disorder (ADHD), predominantly inattentive type 02/08/2020     Plan:   1. Supervision of high risk pregnancy, antepartum  - CBC/D/Plt+RPR+Rh+ABO+Rub Ab... - Culture, OB Urine - Genetic Screening - AFP,  Serum, Open Spina Bifida - [place] MFM OB LIMITED; Future - Urine cytology ancillary only([place]) - [place] MFM OB DETAIL +14 WK; Future - HgB A1c - Ambulatory referral to Integrated Behavioral Health - Start BASA 81 mg daily   2. Cervical insufficiency during pregnancy in second trimester, antepartum  - MD visit ASAP to discuss cerclage placement.  - HgB A1c - MFM [place] for cervical length.   3. History of cryosurgery   Initial labs drawn. Continue prenatal vitamins. Problem list reviewed and updated. Genetic Screening discussed, NIPS: requested. Ultrasound discussed; fetal anatomic survey: ordered. Anticipatory guidance about prenatal visits given including labs, ultrasounds, and testing. Discussed usage of Babyscripts and virtual visits as additional source of managing and completing prenatal visits in midst of coronavirus and pandemic.   Encouraged to complete MyChart Registration for her ability to review results, send requests, and have questions addressed.  The nature of [facility] [facility]Faculty Practice with multiple MDs and Advanced Practice Providers was explained to patient; also emphasized that residents, students are part of our team. Routine obstetric precautions reviewed. Encouraged to seek out care at office or emergency room [facility] preferred) for urgent and/or emergent concerns. Return MD only- please schedule MD visit next week for cerclage discussion.Marland Kitchen     Demarian Epps, Harolyn Rutherford, NP Faculty Practice Center for [employer], [facility]

## 2021-02-16 ENCOUNTER — Other Ambulatory Visit: Payer: Self-pay | Admitting: Obstetrics and Gynecology

## 2021-02-16 DIAGNOSIS — O3432 Maternal care for cervical incompetence, second trimester: Secondary | ICD-10-CM | POA: Insufficient documentation

## 2021-02-16 DIAGNOSIS — O0990 Supervision of high risk pregnancy, unspecified, unspecified trimester: Secondary | ICD-10-CM

## 2021-02-16 DIAGNOSIS — O3433 Maternal care for cervical incompetence, third trimester: Secondary | ICD-10-CM | POA: Insufficient documentation

## 2021-02-16 HISTORY — DX: Maternal care for cervical incompetence, third trimester: O34.33

## 2021-02-16 LAB — CERVICOVAGINAL ANCILLARY ONLY
Bacterial Vaginitis (gardnerella): POSITIVE — AB
Candida Glabrata: NEGATIVE
Candida Vaginitis: NEGATIVE
Chlamydia: NEGATIVE
Comment: NEGATIVE
Comment: NEGATIVE
Comment: NEGATIVE
Comment: NEGATIVE
Comment: NEGATIVE
Comment: NORMAL
Neisseria Gonorrhea: NEGATIVE
Trichomonas: NEGATIVE

## 2021-02-16 LAB — URINE CYTOLOGY ANCILLARY ONLY
Chlamydia: NEGATIVE
Comment: NEGATIVE
Comment: NORMAL
Neisseria Gonorrhea: NEGATIVE

## 2021-02-16 MED ORDER — METRONIDAZOLE 500 MG PO TABS: 500.0000 mg | ORAL_TABLET | Freq: Two times a day (BID) | ORAL | 0 refills | Status: AC

## 2021-02-17 LAB — AFP, SERUM, OPEN SPINA BIFIDA
AFP MoM: 1.52
AFP Value: 45.2 ng/mL
Gest. Age on Collection Date: 15.2 weeks
Maternal Age At EDD: 31.5 yr
OSBR Risk 1 IN: 2591
Test Results:: NEGATIVE
Weight: 162 [lb_av]

## 2021-02-17 LAB — CBC/D/PLT+RPR+RH+ABO+RUB AB...
Antibody Screen: NEGATIVE
Basophils Absolute: 0.1 10*3/uL (ref 0.0–0.2)
Basos: 1 %
EOS (ABSOLUTE): 0.1 10*3/uL (ref 0.0–0.4)
Eos: 1 %
HCV Ab: <0.1 s/co ratio (ref 0.0–0.9)
HIV Screen 4th Generation wRfx: NONREACTIVE
Hematocrit: 31.8 % — ABNORMAL LOW (ref 34.0–46.6)
Hemoglobin: 10.7 g/dL — ABNORMAL LOW (ref 11.1–15.9)
Hepatitis B Surface Ag: NEGATIVE
Immature Grans (Abs): 0.1 10*3/uL (ref 0.0–0.1)
Immature Granulocytes: 1 %
Lymphocytes Absolute: 2.6 10*3/uL (ref 0.7–3.1)
Lymphs: 22 %
MCH: 29.3 pg (ref 26.6–33.0)
MCHC: 33.6 g/dL (ref 31.5–35.7)
MCV: 87 fL (ref 79–97)
Monocytes Absolute: 0.6 10*3/uL (ref 0.1–0.9)
Monocytes: 5 %
Neutrophils Absolute: 8.2 10*3/uL — ABNORMAL HIGH (ref 1.4–7.0)
Neutrophils: 70 %
Platelets: 362 10*3/uL (ref 150–450)
RBC: 3.65 x10E6/uL — ABNORMAL LOW (ref 3.77–5.28)
RDW: 12.5 % (ref 11.7–15.4)
RPR Ser Ql: NONREACTIVE
Rh Factor: POSITIVE
Rubella Antibodies, IGG: 5.55 index (ref >0.99)
WBC: 11.7 10*3/uL — ABNORMAL HIGH (ref 3.4–10.8)

## 2021-02-17 LAB — HEMOGLOBIN A1C
Est. average glucose Bld gHb Est-mCnc: 114 mg/dL
Hgb A1c MFr Bld: 5.6 % (ref 4.8–5.6)

## 2021-02-17 LAB — CULTURE, OB URINE

## 2021-02-17 LAB — URINE CULTURE, OB REFLEX

## 2021-02-17 LAB — HCV INTERPRETATION

## 2021-02-22 ENCOUNTER — Other Ambulatory Visit: Payer: Self-pay

## 2021-02-22 ENCOUNTER — Ambulatory Visit (INDEPENDENT_AMBULATORY_CARE_PROVIDER_SITE_OTHER): Payer: Medicaid Other | Admitting: Obstetrics and Gynecology

## 2021-02-22 VITALS — BP 122/78 | HR 91 | Wt 165.0 lb

## 2021-02-22 DIAGNOSIS — O0990 Supervision of high risk pregnancy, unspecified, unspecified trimester: Secondary | ICD-10-CM

## 2021-02-22 DIAGNOSIS — Z3A16 16 weeks gestation of pregnancy: Secondary | ICD-10-CM

## 2021-02-22 DIAGNOSIS — Z8759 Personal history of other complications of pregnancy, childbirth and the puerperium: Secondary | ICD-10-CM

## 2021-02-22 DIAGNOSIS — O3432 Maternal care for cervical incompetence, second trimester: Secondary | ICD-10-CM

## 2021-02-22 DIAGNOSIS — Z9889 Other specified postprocedural states: Secondary | ICD-10-CM

## 2021-02-22 NOTE — Progress Notes (Signed)
   PRENATAL VISIT NOTE  Subjective:  Ruth Ellis is a 30 y.o. G6P0050 at [redacted]w[redacted]d being seen today for ongoing prenatal care.  She is currently monitored for the following issues for this high-risk pregnancy and has Generalized anxiety disorder; Attention deficit hyperactivity disorder (ADHD), predominantly inattentive type; Bereavement; History of preterm premature rupture of membranes (PPROM); Supervision of high risk pregnancy, antepartum; History of cryosurgery; and Cervical insufficiency during pregnancy in second trimester, antepartum on their problem list.  Patient reports no complaints.  Contractions: Not present. Vag. Bleeding: None.  Movement: Present. Denies leaking of fluid.   The following portions of the patient's history were reviewed and updated as appropriate: allergies, current medications, past family history, past medical history, past social history, past surgical history and problem list.   Objective:   Vitals:   02/22/21 1553  BP: 122/78  Pulse: 91  Weight: 165 lb (74.8 kg)    Fetal Status: Fetal Heart Rate (bpm): 155   Movement: Present     General:  Alert, oriented and cooperative. Patient is in no acute distress.  Skin: Skin is warm and dry. No rash noted.   Cardiovascular: Normal heart rate noted  Respiratory: Normal respiratory effort, no problems with respiration noted  Abdomen: Soft, gravid, appropriate for gestational age.  Pain/Pressure: Absent     Pelvic: Cervical exam deferred        Extremities: Normal range of motion.     Mental Status: Normal mood and affect. Normal behavior. Normal judgment and thought content.   Assessment and Plan:  Pregnancy: G6P0050 at [redacted]w[redacted]d  1. Cervical insufficiency during pregnancy in second trimester, antepartum - Patient with history of PPROM and subsequent delivery at 18 weeks, cerclage eligible - reviewed risks/benefits of cerclage today - she has appt with MFM for cervical length in 5 days, will see what those  results show and have discussion with MFM regarding candidacy for cerclage - she will also bring records from that delivery - patient agreeable to plan, she is comfortable waiting at this point although likley will opt for cerclage if possible  2. History of preterm premature rupture of membranes (PPROM)  3. Supervision of high risk pregnancy, antepartum  4. History of cryosurgery  5. [redacted] weeks gestation of pregnancy   Preterm labor symptoms and general obstetric precautions including but not limited to vaginal bleeding, contractions, leaking of fluid and fetal movement were reviewed in detail with the patient. Please refer to After Visit Summary for other counseling recommendations.   Return in about 2 weeks (around 03/08/2021) for high OB, in person.  Future Appointments  Date Time Provider Department Center  02/27/2021 11:15 AM [facility] NURSE [facility]  02/27/2021 11:30 AM [facility] US3 [facility]  02/27/2021  2:00 PM Gwyndolyn Saxon, LCSW [facility] None  03/09/2021  9:15 AM Adam Phenix, MD [facility] None  03/19/2021  2:30 PM [facility] NURSE [facility]  03/19/2021  2:45 PM [facility] US5 [facility]    Conan Bowens, MD

## 2021-02-26 ENCOUNTER — Encounter: Payer: Self-pay | Admitting: Obstetrics and Gynecology

## 2021-02-27 ENCOUNTER — Encounter: Payer: Self-pay | Admitting: *Deleted

## 2021-02-27 ENCOUNTER — Ambulatory Visit: Payer: Medicaid Other | Attending: Obstetrics and Gynecology

## 2021-02-27 ENCOUNTER — Ambulatory Visit (INDEPENDENT_AMBULATORY_CARE_PROVIDER_SITE_OTHER): Payer: Medicaid Other | Admitting: Licensed Clinical Social Worker

## 2021-02-27 ENCOUNTER — Other Ambulatory Visit: Payer: Self-pay

## 2021-02-27 ENCOUNTER — Ambulatory Visit: Payer: Medicaid Other | Admitting: *Deleted

## 2021-02-27 DIAGNOSIS — O4442 Low lying placenta NOS or without hemorrhage, second trimester: Secondary | ICD-10-CM | POA: Insufficient documentation

## 2021-02-27 DIAGNOSIS — Z3686 Encounter for antenatal screening for cervical length: Secondary | ICD-10-CM

## 2021-02-27 DIAGNOSIS — O3432 Maternal care for cervical incompetence, second trimester: Secondary | ICD-10-CM | POA: Insufficient documentation

## 2021-02-27 DIAGNOSIS — O99340 Other mental disorders complicating pregnancy, unspecified trimester: Secondary | ICD-10-CM | POA: Diagnosis not present

## 2021-02-27 DIAGNOSIS — F419 Anxiety disorder, unspecified: Secondary | ICD-10-CM

## 2021-02-27 DIAGNOSIS — O0992 Supervision of high risk pregnancy, unspecified, second trimester: Secondary | ICD-10-CM | POA: Diagnosis not present

## 2021-02-27 DIAGNOSIS — O0990 Supervision of high risk pregnancy, unspecified, unspecified trimester: Secondary | ICD-10-CM

## 2021-02-27 DIAGNOSIS — Z9889 Other specified postprocedural states: Secondary | ICD-10-CM | POA: Insufficient documentation

## 2021-02-27 DIAGNOSIS — Z3A00 Weeks of gestation of pregnancy not specified: Secondary | ICD-10-CM

## 2021-02-27 DIAGNOSIS — Z3A17 17 weeks gestation of pregnancy: Secondary | ICD-10-CM

## 2021-02-27 NOTE — Progress Notes (Unsigned)
MFM brief note  Ms. Towe is here for cervical length measurment due to a prior pregnancy resulting in PPROM at 18 weeks.   Today's CL was normal.  In addition, we observed a low lying placenta, which was 1.4 cm from the internal os. I discussed that a low lying placenta, is defined as a placental edge lies 2.0 cm or closer to the internal os. Although no activity restriction is necessary, this finding can be associated with an increased risk of bleeding, and your patient should be counseled as such. The patient can be reassured that this condition has a high rate of spontaneous resolution, although should this condition persist, a cesarean delivery may be necessary.   A follow up exam is recommended in 6-8 weeks. I reassured her that I suspect the placenta to be safely from the cervix by 32-34 week.  At this time we CL every 2 weeks. A detailed anatomy was scheduled previously in 3 weeks (we will perform the next CL at that time).   All questions answered.  I spent 15 minute with > 50% in face to face consultation.  Novella Olive, MD.

## 2021-02-28 NOTE — BH Specialist Note (Signed)
Integrated Behavioral Health via Telemedicine Visit  02/28/2021 Ruth Ellis [id number]  Number of Integrated Behavioral Health visits: 1/6 Session Start time: 2:00pm Session End time: 2:45pm Total time: 45  via phone due to poor wifi connection   Referring Provider: Blanche East NP Patient/Family location: Home  [facility] Provider location: LCSWA A. Felton Clinton  All persons participating in visit: Pt Ruth Ellis and LCSWA A. Julie Paolini  Types of Service: Individual psychotherapy and Telephone visit  I connected with Ruth Ellis and/or Ruth Ellis N/A via  Telephone or Engineer, civil (consulting)  (Video is Caregility application) and verified that I am speaking with the correct person using two identifiers. Discussed confidentiality: Yes   I discussed the limitations of telemedicine and the availability of in person appointments.  Discussed there is a possibility of technology failure and discussed alternative modes of communication if that failure occurs.  I discussed that engaging in this telemedicine visit, they consent to the provision of behavioral healthcare and the services will be billed under their insurance.  Patient and/or legal guardian expressed understanding and consented to Telemedicine visit: Yes   Presenting Concerns: Patient and/or family reports the following symptoms/concerns: depressed mood, social isolation, limited social support  Duration of problem: 3 weeks; Severity of problem: mild  Patient and/or Family's Strengths/Protective Factors: Concrete supports in place (healthy food, safe environments, etc.)  Goals Addressed: Patient will: 1.  Reduce symptoms of: depression  2.  Increase knowledge and/or ability of: coping skills and stress reduction  3.  Demonstrate ability to: Increase adequate support systems for patient/family  Progress towards Goals: Ongoing  Interventions: Interventions utilized:  Supportive Counseling Standardized Assessments  completed: PHQ 9  Assessment: Patient currently experiencing anxiety affecting pregnancy  Patient may benefit from integrated behavioral health   Plan: 1. Follow up with behavioral health clinician on : 3 weeks via mychart  2. Behavioral recommendations: Stress importance of sleep hygiene, communicate needs to spouse for added support, continue taking prenatal vitamins, begin journal writing to identify triggers and increase emotion regulation   3. Referral(s): Integrated [employer] (In Clinic)  I discussed the assessment and treatment plan with the patient and/or parent/guardian. They were provided an opportunity to ask questions and all were answered. They agreed with the plan and demonstrated an understanding of the instructions.   They were advised to call back or seek an in-person evaluation if the symptoms worsen or if the condition fails to improve as anticipated.  Gwyndolyn Saxon, LCSW

## 2021-03-01 ENCOUNTER — Encounter: Payer: Self-pay | Admitting: Obstetrics and Gynecology

## 2021-03-03 ENCOUNTER — Other Ambulatory Visit: Payer: Self-pay | Admitting: Obstetrics and Gynecology

## 2021-03-03 DIAGNOSIS — D563 Thalassemia minor: Secondary | ICD-10-CM

## 2021-03-03 HISTORY — DX: Thalassemia minor: D56.3

## 2021-03-05 ENCOUNTER — Encounter: Payer: Self-pay | Admitting: *Deleted

## 2021-03-05 ENCOUNTER — Other Ambulatory Visit: Payer: Self-pay | Admitting: Obstetrics and Gynecology

## 2021-03-05 ENCOUNTER — Telehealth: Payer: Self-pay | Admitting: *Deleted

## 2021-03-05 DIAGNOSIS — O3432 Maternal care for cervical incompetence, second trimester: Secondary | ICD-10-CM

## 2021-03-05 NOTE — Telephone Encounter (Signed)
Call to patient. Advised cerclage placement scheduled for Wednesday, 03-07-21 at 1130, arrive 0930am.  Advised will receive call from Pre-Op nurse with additional instructions for procedure. Patient has additional questions and requests call from Venia Carbon NP. Advised will send message for call back.

## 2021-03-06 ENCOUNTER — Telehealth (HOSPITAL_COMMUNITY): Payer: Self-pay | Admitting: *Deleted

## 2021-03-06 ENCOUNTER — Other Ambulatory Visit (HOSPITAL_COMMUNITY)
Admission: RE | Admit: 2021-03-06 | Discharge: 2021-03-06 | Disposition: A | Discharge status: Home / Self Care | Payer: Medicaid Other | Source: Ambulatory Visit | Attending: Obstetrics and Gynecology | Admitting: Obstetrics and Gynecology

## 2021-03-06 ENCOUNTER — Encounter (HOSPITAL_COMMUNITY): Payer: Self-pay | Admitting: *Deleted

## 2021-03-06 ENCOUNTER — Other Ambulatory Visit: Payer: Self-pay | Admitting: Obstetrics and Gynecology

## 2021-03-06 DIAGNOSIS — Z20822 Contact with and (suspected) exposure to covid-19: Secondary | ICD-10-CM | POA: Diagnosis not present

## 2021-03-06 DIAGNOSIS — Z01812 Encounter for preprocedural laboratory examination: Secondary | ICD-10-CM | POA: Insufficient documentation

## 2021-03-06 NOTE — Telephone Encounter (Signed)
Preadmission screen  

## 2021-03-07 ENCOUNTER — Observation Stay (HOSPITAL_COMMUNITY)
Admission: RE | Admit: 2021-03-07 | Discharge: 2021-03-07 | Disposition: A | Discharge status: Home / Self Care | Payer: Medicaid Other | Attending: Obstetrics and Gynecology | Admitting: Obstetrics and Gynecology

## 2021-03-07 ENCOUNTER — Encounter (HOSPITAL_COMMUNITY)
Admission: RE | Disposition: A | Discharge status: Home / Self Care | Payer: Self-pay | Source: Home / Self Care | Attending: Obstetrics and Gynecology

## 2021-03-07 ENCOUNTER — Encounter (HOSPITAL_COMMUNITY): Payer: Self-pay | Admitting: Obstetrics and Gynecology

## 2021-03-07 ENCOUNTER — Inpatient Hospital Stay (HOSPITAL_COMMUNITY): Payer: Medicaid Other | Admitting: Anesthesiology

## 2021-03-07 DIAGNOSIS — Z3A18 18 weeks gestation of pregnancy: Secondary | ICD-10-CM | POA: Diagnosis not present

## 2021-03-07 DIAGNOSIS — Z7982 Long term (current) use of aspirin: Secondary | ICD-10-CM | POA: Insufficient documentation

## 2021-03-07 DIAGNOSIS — Z8742 Personal history of other diseases of the female genital tract: Secondary | ICD-10-CM

## 2021-03-07 DIAGNOSIS — O09212 Supervision of pregnancy with history of pre-term labor, second trimester: Secondary | ICD-10-CM

## 2021-03-07 DIAGNOSIS — O3432 Maternal care for cervical incompetence, second trimester: Secondary | ICD-10-CM | POA: Diagnosis not present

## 2021-03-07 DIAGNOSIS — O3430 Maternal care for cervical incompetence, unspecified trimester: Secondary | ICD-10-CM | POA: Diagnosis present

## 2021-03-07 DIAGNOSIS — Z9889 Other specified postprocedural states: Secondary | ICD-10-CM

## 2021-03-07 HISTORY — PX: CERVICAL CERCLAGE: SHX1329

## 2021-03-07 LAB — CBC
HCT: 33.9 % — ABNORMAL LOW (ref 36.0–46.0)
Hemoglobin: 11.1 g/dL — ABNORMAL LOW (ref 12.0–15.0)
MCH: 29.4 pg (ref 26.0–34.0)
MCHC: 32.7 g/dL (ref 30.0–36.0)
MCV: 89.7 fL (ref 80.0–100.0)
Platelets: 333 10*3/uL (ref 150–400)
RBC: 3.78 MIL/uL — ABNORMAL LOW (ref 3.87–5.11)
RDW: 13.2 % (ref 11.5–15.5)
WBC: 11.8 10*3/uL — ABNORMAL HIGH (ref 4.0–10.5)
nRBC: 0 % (ref 0.0–0.2)

## 2021-03-07 LAB — SARS CORONAVIRUS 2 (TAT 6-24 HRS): SARS Coronavirus 2: NEGATIVE

## 2021-03-07 LAB — TYPE AND SCREEN
ABO/RH(D): O POS
Antibody Screen: NEGATIVE

## 2021-03-07 SURGERY — CERCLAGE, CERVIX, VAGINAL APPROACH
Anesthesia: Spinal

## 2021-03-07 MED ORDER — LACTATED RINGERS IV SOLN: INTRAVENOUS | Status: DC | PRN

## 2021-03-07 MED ORDER — IBUPROFEN 600 MG PO TABS: 600.0000 mg | ORAL_TABLET | Freq: Four times a day (QID) | ORAL | 0 refills | Status: AC

## 2021-03-07 MED ORDER — DEXAMETHASONE SODIUM PHOSPHATE 4 MG/ML IJ SOLN
INTRAMUSCULAR | Status: AC
  Filled 2021-03-07: qty 1

## 2021-03-07 MED ORDER — ONDANSETRON HCL 4 MG/2ML IJ SOLN
INTRAMUSCULAR | Status: DC | PRN
  Administered 2021-03-07: 4 mg via INTRAVENOUS

## 2021-03-07 MED ORDER — IBUPROFEN 600 MG PO TABS: 600.0000 mg | ORAL_TABLET | Freq: Four times a day (QID) | ORAL | Status: DC

## 2021-03-07 MED ORDER — CEFAZOLIN SODIUM-DEXTROSE 2-4 GM/100ML-% IV SOLN
2.0000 g | INTRAVENOUS | Status: AC
  Administered 2021-03-07: 2 g via INTRAVENOUS

## 2021-03-07 MED ORDER — BUPIVACAINE HCL (PF) 0.5 % IJ SOLN
INTRAMUSCULAR | Status: AC
  Filled 2021-03-07: qty 30

## 2021-03-07 MED ORDER — FENTANYL CITRATE (PF) 100 MCG/2ML IJ SOLN
INTRAMUSCULAR | Status: AC
  Filled 2021-03-07: qty 2

## 2021-03-07 MED ORDER — CEFAZOLIN SODIUM-DEXTROSE 2-4 GM/100ML-% IV SOLN
INTRAVENOUS | Status: AC
  Filled 2021-03-07: qty 100

## 2021-03-07 MED ORDER — PROMETHAZINE HCL 25 MG/ML IJ SOLN: 6.2500 mg | INTRAMUSCULAR | Status: DC | PRN

## 2021-03-07 MED ORDER — BUPIVACAINE IN DEXTROSE 0.75-8.25 % IT SOLN
INTRATHECAL | Status: DC | PRN
  Administered 2021-03-07: 1 mL via INTRATHECAL

## 2021-03-07 MED ORDER — HYDROMORPHONE HCL 1 MG/ML IJ SOLN: 0.2500 mg | INTRAMUSCULAR | Status: DC | PRN

## 2021-03-07 MED ORDER — FENTANYL CITRATE (PF) 100 MCG/2ML IJ SOLN
INTRAMUSCULAR | Status: DC | PRN
  Administered 2021-03-07: 15 ug via INTRATHECAL

## 2021-03-07 MED ORDER — OXYCODONE HCL 5 MG PO TABS: 5.0000 mg | ORAL_TABLET | Freq: Once | ORAL | Status: DC | PRN

## 2021-03-07 MED ORDER — OXYCODONE HCL 5 MG/5ML PO SOLN: 5.0000 mg | Freq: Once | ORAL | Status: DC | PRN

## 2021-03-07 MED ORDER — LACTATED RINGERS IV SOLN: INTRAVENOUS | Status: DC

## 2021-03-07 MED ORDER — ONDANSETRON HCL 4 MG/2ML IJ SOLN
INTRAMUSCULAR | Status: AC
  Filled 2021-03-07: qty 2

## 2021-03-07 MED ORDER — KETOROLAC TROMETHAMINE 30 MG/ML IJ SOLN: 30.0000 mg | Freq: Once | INTRAMUSCULAR | Status: DC | PRN

## 2021-03-07 MED ORDER — PHENYLEPHRINE HCL-NACL 20-0.9 MG/250ML-% IV SOLN
INTRAVENOUS | Status: AC
  Filled 2021-03-07: qty 250

## 2021-03-07 SURGICAL SUPPLY — 19 items
CANISTER SUCT 3000ML PPV (MISCELLANEOUS) ×2 IMPLANT
DECANTER SPIKE VIAL GLASS SM (MISCELLANEOUS) IMPLANT
GLOVE BIOGEL PI IND STRL 7.0 (GLOVE) ×2 IMPLANT
GLOVE BIOGEL PI INDICATOR 7.0 (GLOVE) ×2
GLOVE SURG SS PI 7.0 STRL IVOR (GLOVE) ×2 IMPLANT
GOWN STRL REUS W/TWL LRG LVL3 (GOWN DISPOSABLE) ×2 IMPLANT
GOWN STRL REUS W/TWL XL LVL3 (GOWN DISPOSABLE) ×2 IMPLANT
NS IRRIG 1000ML POUR BTL (IV SOLUTION) ×2 IMPLANT
PACK VAGINAL MINOR WOMEN LF (CUSTOM PROCEDURE TRAY) ×2 IMPLANT
PAD OB MATERNITY 4.3X12.25 (PERSONAL CARE ITEMS) ×2 IMPLANT
PAD PREP 24X48 CUFFED NSTRL (MISCELLANEOUS) ×2 IMPLANT
SUT MERSILENE 5MM BP 1 12 (SUTURE) ×2 IMPLANT
SUT SILK 2 0 FSL 18 (SUTURE) ×2 IMPLANT
SUT VIC AB 2-0 CT1 27 (SUTURE) ×1
SUT VIC AB 2-0 CT1 TAPERPNT 27 (SUTURE) ×1 IMPLANT
TOWEL OR 17X24 6PK STRL BLUE (TOWEL DISPOSABLE) ×4 IMPLANT
TRAY FOLEY W/BAG SLVR 14FR (SET/KITS/TRAYS/PACK) ×2 IMPLANT
TUBING NON-CON 1/4 X 20 CONN (TUBING) IMPLANT
YANKAUER SUCT BULB TIP NO VENT (SUCTIONS) IMPLANT

## 2021-03-07 NOTE — H&P (Signed)
Obstetrics & Gynecology Surgical H&P   Date of Surgery: 03/07/2021    Primary OBGYN: [facility]  Reason for Admission: scheduled prophylactic cerlcage  History of Present Illness: Ms. Rasberry is a 30 y.o. G6P0050 @ 18/1, with the above CC. PMHx is significant for h/o 18wk PPROM in 2017.    No OB complaints today  ROS: A 12-point review of systems was performed and negative, except as stated in the above HPI.  OBGYN History: As per HPI. OB History  Gravida Para Term Preterm AB Living  6       5 0  SAB IAB Ectopic Multiple Live Births  3 2     0    # Outcome Date GA Lbr Len/2nd Weight Sex Delivery Anes PTL Lv  6 Current           5 SAB 01/01/17 [redacted]w[redacted]d            Complications: Chromosomal abnormality  4 SAB 07/20/16 [redacted]w[redacted]d   F    ND     Complications: Preterm premature rupture of membranes (PPROM) with unknown onset of labor  3 SAB 08/01/15 [redacted]w[redacted]d         2 IAB  [redacted]w[redacted]d            Complications: History of D&C  1 IAB             Obstetric Comments  PPROM/Cervical insufficiency     Past Medical History: Past Medical History:  Diagnosis Date  . ADHD   . Anxiety   . Depression   . Vaginal Pap smear, abnormal     Past Surgical History: Past Surgical History:  Procedure Laterality Date  . CRYOTHERAPY    . DILATION AND CURETTAGE OF UTERUS      Family History:  Family History  Problem Relation Age of Onset  . Diabetes Mother   . Heart disease Mother   . Renal Disease Mother   . Diabetes Father     Social History:  Social History   Socioeconomic History  . Marital status: Married    Spouse name: Not on file  . Number of children: Not on file  . Years of education: Not on file  . Highest education level: Not on file  Occupational History  . Not on file  Tobacco Use  . Smoking status: Never Smoker  . Smokeless tobacco: Never Used  Vaping Use  . Vaping Use: Never used  Substance and Sexual Activity  . Alcohol use: Not Currently    Comment: Social  . Drug use:  Never  . Sexual activity: Yes  Other Topics Concern  . Not on file  Social History Narrative  . Not on file   Social Determinants of Health   Financial Resource Strain: Not on file  Food Insecurity: Not on file  Transportation Needs: Not on file  Physical Activity: Not on file  Stress: Not on file  Social Connections: Not on file  Intimate Partner Violence: Not on file   Allergy: Allergies  Allergen Reactions  . Kiwi Extract Other (See Comments)    Fruit -  Throat itches    Current Outpatient Medications: Medications Prior to Admission  Medication Sig Dispense Refill Last Dose  . aspirin EC 81 MG tablet Take 81 mg by mouth daily. Swallow whole.   03/06/2021 at Unknown time  . cholecalciferol (VITAMIN D3) 25 MCG (1000 UNIT) tablet Take 1,000 Units by mouth daily.   03/06/2021 at Unknown time  . clotrimazole (LOTRIMIN) 1 %  cream Apply 1 application topically 2 (two) times daily.     . Docosahexaenoic Acid (DHA) 200 MG CAPS Take 200 mg by mouth daily.   03/06/2021 at Unknown time  . ferrous sulfate 325 (65 FE) MG tablet Take 325 mg by mouth daily with breakfast.   03/06/2021 at Unknown time  . Prenatal Vit-Fe Fumarate-FA (PRENATAL VITAMINS) 28-0.8 MG TABS Take 1 tablet by mouth daily.   03/06/2021 at Unknown time  . vitamin C (ASCORBIC ACID) 500 MG tablet Take 500 mg by mouth daily.   03/06/2021 at Unknown time     Hospital Medications: Current Facility-Administered Medications  Medication Dose Route Frequency Provider Last Rate Last Admin  . ceFAZolin (ANCEF) IVPB 2g/100 mL premix  2 g Intravenous On Call to OR [place] Bing, MD      . lactated ringers infusion   Intravenous Continuous [place] Bing, MD         Physical Exam:  Current Vital Signs 24h Vital Sign Ranges  T 98.5 F (36.9 C) Temp  Avg: 98.5 F (36.9 C)  Min: 98.5 F (36.9 C)  Max: 98.5 F (36.9 C)  BP 122/79 BP  Min: 122/79  Max: 122/79  HR 82 Pulse  Avg: 82  Min: 82  Max: 82  RR 18 Resp  Avg: 18  Min:  18  Max: 18  SaO2 100 %   SpO2  Avg: 100 %  Min: 100 %  Max: 100 %       24 Hour I/O Current Shift I/O  Time Ins Outs No intake/output data recorded. No intake/output data recorded.    Body mass index is 27.12 kg/m. General appearance: Well nourished, well developed female in no acute distress.  Cardiovascular: S1, S2 normal, no murmur, rub or gallop, regular rate and rhythm Respiratory:  Clear to auscultation bilateral. Normal respiratory effort Abdomen: positive bowel sounds and no masses, hernias; diffusely non tender to palpation, non distended Neuro/Psych:  Normal mood and affect.  Skin:  Warm and dry.  Extremities: no clubbing, cyanosis, or edema.    Laboratory: COVID: negative  Recent Labs  Lab 03/07/21 0929  WBC 11.8*  HGB 11.1*  HCT 33.9*  PLT 333   No results for input(s): NA, K, CL, CO2, BUN, CREATININE, CALCIUM, PROT, BILITOT, ALKPHOS, ALT, AST, GLUCOSE in the last 168 hours.  Invalid input(s): LABALBU No results for input(s): APTT, INR, PTT in the last 168 hours.  Invalid input(s): DRHAPTT Recent Labs  Lab 03/07/21 0945  ABORH PENDING    Imaging:  No new imaging Posterior low lying placenta, 1.4cm from os, 3.5cm transvag CL  Assessment: Ms. Zucco is a 30 y.o. G6P0050 at 18/1wks here for scheduled prophylactic cerclage  Plan: Looking at her records and in talking to her she states that she started feeling some cramping and pain and then LOF at home and then dx with PPROM and labor and she delivered without induction shortly thereafter at the hospital; she was due for anatomy u/s later on that week   I told her that it's hard to say if she has classic cervical insufficiency but that's very reasonable to offer her a cerclage today with the other option being q2wk CLs and a rescue cerclage if s/s of cx shortening. Patient would like to do cerlcage after d/w her re: r/b.   Cornelia Copa MD Attending Center for [employer] (Faculty  Practice) [phone]

## 2021-03-07 NOTE — Op Note (Signed)
03/07/2021  PRE-OP DIAGNOSIS: Pregnancy at 18/1. Patient desire for prophylactic cerclage   POST-OP DIAGNOSIS: Same  SURGEON: Surgeon(s) and Role:    * Lorayne Getchell, Billey Gosling, MD - Primary  ASSISTANT: None  PROCEDURE:  Prophylactic [place] Cerclage with [place] Tape  ANESTHESIA: Spinal  ESTIMATED BLOOD LOSS: 66mL  DRAINS: Indwelling foley placed  TOTAL IV FLUIDS: per anesthesia note  SPECIMENS: None  VTE PROPHYLAXIS: SCDs to the bilateral lower extremities  ANTIBIOTICS: Ancef 2g IV x 1 pre op  COMPLICATIONS: none  DISPOSITION: PACU - hemodynamically stable.  CONDITION: stable  FINDINGS: Exam under anesthesia revealed 18 week sized uterus with no masses and bilateral adnexa without masses or fullness. Speculum exam with normal appearing cervix and closed. Cervical length felt to be 3cm and thick, closed. Knot with air knot placed at 12 o'clock  PROCEDURE IN DETAIL:  After informed consent was obtained, the patient was taken to the operating room where anesthesia was obtained without difficulty. The patient was positioned in the dorsal lithotomy position in [place] stirrups. The patient was examined under anesthesia, with the above noted findings.     Weighted speculum and Deaver retractor placed and anterior lip of cervix grasped with ringed forceps. Going counter clockwise and starting at 11 o'clock, three bites were taken, coming out at 1 o'clock. This was tied and a few air knots placed above this.   Excellent hemostasis was noted, and all instruments were removed, with excellent hemostasis noted throughout.  She was then taken out of dorsal lithotomy.  The patient tolerated the procedure well.  Sponge, lap and instrument counts were correct x2.  The patient was taken to recovery room in excellent condition.  Cornelia Copa MD Attending Center for [employer] Midwife)

## 2021-03-07 NOTE — Anesthesia Postprocedure Evaluation (Signed)
Anesthesia Post Note  Patient: Ruth Ellis  Procedure(s) Performed: CERCLAGE CERVICAL (N/A )     Patient location during evaluation: PACU Anesthesia Type: Spinal Level of consciousness: awake and alert Pain management: pain level controlled Vital Signs Assessment: post-procedure vital signs reviewed and stable Respiratory status: spontaneous breathing, nonlabored ventilation and respiratory function stable Cardiovascular status: blood pressure returned to baseline and stable Postop Assessment: no apparent nausea or vomiting Anesthetic complications: no   No complications documented.  Last Vitals:  Vitals:   03/07/21 1330 03/07/21 1345  BP: 116/66 116/60  Pulse: 79 82  Resp: 20 19  Temp:    SpO2: 100% 100%    Last Pain:  Vitals:   03/07/21 1345  TempSrc:   PainSc: 0-No pain   Pain Goal:                Epidural/Spinal Function Cutaneous sensation: Able to Wiggle Toes (03/07/21 1330), Patient able to flex knees: No (03/07/21 1330), Patient able to lift hips off bed: No (03/07/21 1330), Back pain beyond tenderness at insertion site: No (03/07/21 1330), Progressively worsening motor and/or sensory loss: No (03/07/21 1330), Bowel and/or bladder incontinence post epidural: No (03/07/21 1330)  Lowella Curb

## 2021-03-07 NOTE — Transfer of Care (Signed)
Immediate Anesthesia Transfer of Care Note  Patient: Ruth Ellis  Procedure(s) Performed: CERCLAGE CERVICAL (N/A )  Patient Location: PACU  Anesthesia Type:Spinal  Level of Consciousness: awake, alert  and oriented  Airway & Oxygen Therapy: Patient Spontanous Breathing  Post-op Assessment: Report given to RN and Post -op Vital signs reviewed and stable  Post vital signs: Reviewed and stable  Last Vitals:  Vitals Value Taken Time  BP    Temp    Pulse    Resp    SpO2      Last Pain:  Vitals:   03/07/21 0937  TempSrc: Oral         Complications: No complications documented.

## 2021-03-07 NOTE — Anesthesia Preprocedure Evaluation (Signed)
Anesthesia Evaluation  Patient identified by MRN, date of birth, ID band Patient awake    Reviewed: Allergy & Precautions, NPO status , Patient's Chart, lab work & pertinent test results  Airway Mallampati: II  TM Distance: >3 FB Neck ROM: Full    Dental no notable dental hx.    Pulmonary neg pulmonary ROS,    Pulmonary exam normal breath sounds clear to auscultation       Cardiovascular negative cardio ROS Normal cardiovascular exam Rhythm:Regular Rate:Normal     Neuro/Psych Anxiety Depression negative neurological ROS  negative psych ROS   GI/Hepatic negative GI ROS, Neg liver ROS,   Endo/Other  negative endocrine ROS  Renal/GU negative Renal ROS  negative genitourinary   Musculoskeletal negative musculoskeletal ROS (+)   Abdominal   Peds negative pediatric ROS (+)  Hematology negative hematology ROS (+)   Anesthesia Other Findings   Reproductive/Obstetrics (+) Pregnancy                             Anesthesia Physical Anesthesia Plan  ASA: II  Anesthesia Plan: Spinal   Post-op Pain Management:    Induction:   PONV Risk Score and Plan: 2 and Ondansetron, Midazolam and Treatment may vary due to age or medical condition  Airway Management Planned: Natural Airway  Additional Equipment:   Intra-op Plan:   Post-operative Plan:   Informed Consent: I have reviewed the patients History and Physical, chart, labs and discussed the procedure including the risks, benefits and alternatives for the proposed anesthesia with the patient or authorized representative who has indicated his/her understanding and acceptance.     Dental advisory given  Plan Discussed with: CRNA  Anesthesia Plan Comments:         Anesthesia Quick Evaluation

## 2021-03-07 NOTE — Anesthesia Procedure Notes (Signed)
Spinal  Patient location during procedure: OB Start time: 03/07/2021 11:09 AM End time: 03/07/2021 11:14 AM Reason for block: surgical anesthesia Staffing Performed: anesthesiologist  Anesthesiologist: Lowella Curb, MD Preanesthetic Checklist Completed: patient identified, IV checked, risks and benefits discussed, surgical consent, monitors and equipment checked, pre-op evaluation and timeout performed Spinal Block Patient position: sitting Prep: DuraPrep and site prepped and draped Patient monitoring: heart rate, cardiac monitor, continuous pulse ox and blood pressure Approach: midline Location: L3-4 Injection technique: single-shot Needle Needle type: Pencan  Needle gauge: 24 G Needle length: 10 cm Assessment Sensory level: T4 Events: CSF return

## 2021-03-07 NOTE — Discharge Instructions (Signed)
Cervical Cerclage, Care After This sheet gives you information about how to care for yourself after your procedure. Your health care provider may also give you more specific instructions. If you have problems or questions, contact your health care provider. What can I expect after the procedure? After your procedure, it is common to have:  Cramping in your abdomen.  Mucus discharge from your vagina. This may last for several days.  Painful urination (dysuria).  Spotting, or small drops of blood coming from your vagina. Follow these instructions at home: Medicines  Take over-the-counter and prescription medicines only as told by your health care provider.  Ask your health care provider if the medicine prescribed to you requires you to avoid driving or using heavy machinery. General instructions  If you are told to go on bed rest, follow instructions from your health care provider. You may need to be on bed rest for up to 3 days.  Keep track of your vaginal discharge and watch for any changes. If you notice changes, tell your health care provider.  Avoid physical activities and exercise until your health care provider approves. Ask your health care provider what activities are safe for you.  Do not douche or have sex until your health care provider says it is okay to do so.  Keep all follow-up visits, including prenatal visits, as told by your health care provider. This is important. ? Prenatal visits are all the care that you receive before the birth of your baby. ? You may also need an ultrasound.  You may be asked to have weekly visits to have your cervix checked.   Contact a health care provider if you:  Have abnormal discharge from your vagina, such as clots.  Have a bad-smelling discharge from your vagina.  Develop a rash on your skin. This may look like redness and swelling.  Become light-headed or feel like you are going to faint.  Have abdominal pain that does not  get better with medicine.  Have nausea or vomiting that does not go away. Get help right away if you:  Have vaginal bleeding that is heavier or more frequent than spotting.  Are leaking fluid or your water breaks.  Have a fever or chills.  Faint.  Have uterine contractions. These may feel like: ? A back ache. ? Lower abdominal pain. ? Mild cramps, similar to menstrual cramps. ? Tightening or pressure in your abdomen.  Think that your baby is not moving as much as usual, or you cannot feel your baby move.  Have chest pain.  Have shortness of breath. Summary  After the procedure, it is common to have cramping, vaginal discharge, painful urination, and small drops of blood coming from your vagina.  If you are told to go on bed rest, follow instructions from your health care provider. You may need to be on bed rest for up to 3 days.  Keep track of your vaginal discharge and watch for any changes. If you notice changes, tell your health care provider.  Contact a health care provider if you have abnormal vaginal discharge, become light-headed, or have pain that cannot be controlled with medicines.  Get help right away if you have heavy vaginal bleeding, your water breaks, or you have uterine contractions. Also, get help right away if your baby is not moving as much as usual, or you have chest pain or shortness of breath. This information is not intended to replace advice given to you by your health care   provider. Make sure you discuss any questions you have with your health care provider. Document Revised: 12/17/2019 Document Reviewed: 06/23/2019 Elsevier Patient Education  2021 Elsevier Inc.  

## 2021-03-09 ENCOUNTER — Encounter: Payer: Medicaid Other | Admitting: Medical

## 2021-03-11 NOTE — Discharge Summary (Signed)
Gynecology Discharge Summary Date of Admission: 03/07/2021 Date of Discharge: 03/07/2021  The patient was admitted, as scheduled, and underwent a prophylactic cerclage at 18wks; please refer to operative note for full details.  She was meeting all post op goals and discharged to home from the PACU.   Allergies as of 03/07/2021      Reactions   Kiwi Extract Other (See Comments)   Fruit -  Throat itches      Medication List    TAKE these medications   aspirin EC 81 MG tablet Take 81 mg by mouth daily. Swallow whole.   cholecalciferol 25 MCG (1000 UNIT) tablet Commonly known as: VITAMIN D3 Take 1,000 Units by mouth daily.   clotrimazole 1 % cream Commonly known as: LOTRIMIN Apply 1 application topically 2 (two) times daily.   DHA 200 MG Caps Take 200 mg by mouth daily.   ferrous sulfate 325 (65 FE) MG tablet Take 325 mg by mouth daily with breakfast.   Prenatal Vitamins 28-0.8 MG Tabs Take 1 tablet by mouth daily.   vitamin C 500 MG tablet Commonly known as: ASCORBIC ACID Take 500 mg by mouth daily.     ASK your doctor about these medications   ibuprofen 600 MG tablet Commonly known as: ADVIL Take 1 tablet (600 mg total) by mouth every 6 (six) hours for 7 doses. Ask about: Should I take this medication?       Future Appointments  Date Time Provider Department Center  03/13/2021  3:00 PM Warden Fillers, MD [facility] None  03/19/2021  2:30 PM [facility] NURSE [facility]  03/19/2021  2:45 PM [facility] US5 [facility]    Cornelia Copa MD Attending Center for [facility])

## 2021-03-13 ENCOUNTER — Other Ambulatory Visit: Payer: Self-pay

## 2021-03-13 ENCOUNTER — Ambulatory Visit (INDEPENDENT_AMBULATORY_CARE_PROVIDER_SITE_OTHER): Payer: Medicaid Other | Admitting: Obstetrics and Gynecology

## 2021-03-13 VITALS — BP 119/84 | HR 90 | Wt 167.1 lb

## 2021-03-13 DIAGNOSIS — Z9889 Other specified postprocedural states: Secondary | ICD-10-CM

## 2021-03-13 DIAGNOSIS — D563 Thalassemia minor: Secondary | ICD-10-CM

## 2021-03-13 DIAGNOSIS — O3432 Maternal care for cervical incompetence, second trimester: Secondary | ICD-10-CM

## 2021-03-13 DIAGNOSIS — Z3A19 19 weeks gestation of pregnancy: Secondary | ICD-10-CM

## 2021-03-13 DIAGNOSIS — F900 Attention-deficit hyperactivity disorder, predominantly inattentive type: Secondary | ICD-10-CM

## 2021-03-13 DIAGNOSIS — F411 Generalized anxiety disorder: Secondary | ICD-10-CM

## 2021-03-13 DIAGNOSIS — Z8759 Personal history of other complications of pregnancy, childbirth and the puerperium: Secondary | ICD-10-CM | POA: Diagnosis not present

## 2021-03-13 DIAGNOSIS — O0990 Supervision of high risk pregnancy, unspecified, unspecified trimester: Secondary | ICD-10-CM

## 2021-03-13 DIAGNOSIS — Z8742 Personal history of other diseases of the female genital tract: Secondary | ICD-10-CM

## 2021-03-13 MED ORDER — HYDROXYPROGESTERONE CAPROATE 275 MG/1.1ML ~~LOC~~ SOAJ
275.0000 mg | Freq: Once | SUBCUTANEOUS | Status: AC
  Administered 2021-03-13: 275 mg via SUBCUTANEOUS

## 2021-03-13 NOTE — Progress Notes (Signed)
Patient presents for Rob. Patient has concerns about previous ring worm and a possible new ring worm coming up. She also wants to discuss her cerclage.

## 2021-03-13 NOTE — Patient Instructions (Signed)
Cervical Cerclage  Cervical cerclage is a surgical procedure to correct a cervix that opens up and thins out before pregnancy is at term. This is also called cervical insufficiency, or incompetent cervix. This condition can cause labor to start early (prematurely). In this procedure, a health care provider uses stitches (sutures) to sew the cervix shut during pregnancy. Your health care provider may use ultrasound to help guide the procedure and monitor your baby. Ultrasound uses sound waves to take images of your cervix and uterus. The health care provider will assess these images on a monitor in the operating room. Tell a health care provider about:  Any allergies you have, especially any allergies related to prescribed medicine, stitches, or anesthetic medicines.  All medicines you are taking, including vitamins, herbs, eye drops, creams, and over-the-counter medicines.  Your medical history, including prior labor deliveries.  Any problems you or family members have had with anesthetic medicines.  Any blood disorders you have.  Any surgeries you have had, including prior cervical stitching.  Any medical conditions you have or have had. What are the risks? Generally, this is a safe procedure. However, problems may occur, including:  Infection, such as infection of the cervix or the bag of fluid that surrounds the baby (amniotic sac).  Vaginal bleeding.  Allergic reactions to medicines.  Damage to nearby structures or organs, such as injury to the cervix or tearing of the amniotic sac.  Contractions that come too early, including going into early labor and delivery.  Cervical dystocia. This occurs when the cervix is unable to open normally during labor. What happens before the procedure? Staying hydrated Follow instructions from your health care provider about hydration, which may include:  Up to 2 hours before the procedure - you may continue to drink clear liquids, such as  water, clear fruit juice, black coffee, and plain tea.   Eating and drinking restrictions Follow instructions from your health care provider about eating and drinking, which may include:  8 hours before the procedure - stop eating heavy meals or foods, such as meat, fried foods, or fatty foods.  6 hours before the procedure - stop eating light meals or foods, such as toast or cereal.  6 hours before the procedure - stop drinking milk or drinks that contain milk.  2 hours before the procedure - stop drinking clear liquids. Medicines Ask your health care provider about:  Changing or stopping your regular medicines. This is especially important if you are taking diabetes medicines or blood thinners.  Taking medicines such as aspirin and ibuprofen. These medicines can thin your blood. Do not take these medicines unless your health care provider tells you to take them.  Taking over-the-counter medicines, vitamins, herbs, and supplements. Surgery safety Ask your health care provider:  How your surgery site will be marked.  What steps will be taken to help prevent infection. These may include: ? Removing hair at the surgery site. ? Washing skin with a germ-killing soap. ? Taking antibiotic medicine. General instructions  Do not put on any lotion, deodorant, or perfume.  Remove contact lenses and jewelry.  You may have an exam or testing, including blood or urine tests.  Plan to have someone take you home from the hospital or clinic.  If you will be going home right after the procedure, plan to have someone with you for 24 hours. What happens during the procedure?  An IV will be inserted into one of your veins.  You may be given  one or more of the following: ? A medicine to help you relax (sedative). ? A medicine to numb the area (local anesthetic). ? A medicine to make you fall asleep (general anesthetic). ? A medicine that is injected into your spine to numb the area below  and slightly above the injection site (spinal anesthetic).  A lubricated instrument (speculum) will be inserted into your vagina. The speculum will be widened to open the walls of your vagina so your surgeon can see your cervix.  Your cervix will be grasped and tightly sutured to close it. To do this, your surgeon will stitch a strong band of thread around your cervix, then the thread will be tightened to hold your cervix shut. The procedure may vary among health care providers and hospitals. What happens after the procedure?  Your blood pressure, heart rate, breathing rate, and blood oxygen level will be monitored until you leave the hospital or clinic.  You will be monitored for premature contractions.  You may have light bleeding and mild cramping.  You may have to wear compression stockings. These stockings help to prevent blood clots and reduce swelling in your legs.  If you were given a sedative during the procedure, it can affect you for several hours. Do not drive or operate machinery until your health care provider says that it is safe.  You may be put on bed rest.  You may be given an injection of a hormone (progesterone) to prevent premature contractions. Summary  Cervical cerclage is a surgical procedure in which stitches are used to sew the cervix shut during pregnancy.  Before the procedure, tell your health care provider about your medicines, or medical problems or blood disorders that you have.  This is a safe procedure. However, problems may occur, including infection, bleeding, or premature labor.  Follow all instructions about eating and drinking before the procedure. Plan to have someone drive you home from the hospital or clinic. This information is not intended to replace advice given to you by your health care provider. Make sure you discuss any questions you have with your health care provider. Document Revised: 08/24/2019 Document Reviewed: 06/23/2019 Elsevier  Patient Education  2021 Elsevier Inc.  

## 2021-03-13 NOTE — Progress Notes (Signed)
   PRENATAL VISIT NOTE  Subjective:  Ruth Ellis is a 30 y.o. G6P0050 at [redacted]w[redacted]d being seen today for ongoing prenatal care.  She is currently monitored for the following issues for this high-risk pregnancy and has Generalized anxiety disorder; Attention deficit hyperactivity disorder (ADHD), predominantly inattentive type; Bereavement; History of preterm premature rupture of membranes (PPROM); Supervision of high risk pregnancy, antepartum; History of cryosurgery; Cervical insufficiency during pregnancy in second trimester, antepartum; Low lying placenta nos or without hemorrhage, second trimester; Alpha thalassemia silent carrier; Cervical cerclage suture present; H/O cervical incompetence; and [redacted] weeks gestation of pregnancy on their problem list.  Patient doing well with no acute concerns today. She reports no complaints.  Contractions: Irritability. Vag. Bleeding: None.  Movement: Present. Denies leaking of fluid.   Pt recently received cerclage.  Discussed operative note and technique with the patient.  Discussed case with MFM regarding progesterone supplementation and he agrees.  Will start weekly makena with first dose today.  Discussed pelvic rest and possible modification of work out routine due to cerclage.  The following portions of the patient's history were reviewed and updated as appropriate: allergies, current medications, past family history, past medical history, past social history, past surgical history and problem list. Problem list updated.  Objective:   Vitals:   03/13/21 1506  BP: 119/84  Pulse: 90  Weight: 167 lb 1.6 oz (75.8 kg)    Fetal Status: Fetal Heart Rate (bpm): 150   Movement: Present     General:  Alert, oriented and cooperative. Patient is in no acute distress.  Skin: Skin is warm and dry. No rash noted.   Cardiovascular: Normal heart rate noted  Respiratory: Normal respiratory effort, no problems with respiration noted  Abdomen: Soft, gravid,  appropriate for gestational age.  Pain/Pressure: Absent     Pelvic: Cervical exam deferred        Extremities: Normal range of motion.  Edema: None  Mental Status:  Normal mood and affect. Normal behavior. Normal judgment and thought content.   Assessment and Plan:  Pregnancy: G6P0050 at [redacted]w[redacted]d  1. H/O cervical incompetence   2. Cervical insufficiency during pregnancy in second trimester, antepartum Pt s/p cerclage, per MFM bi weekly CL, first scheduled on 5/9 - HYDROXYprogesterone caproate (Makena) autoinjector 275 mg  3. Cervical cerclage suture present in second trimester   4. Supervision of high risk pregnancy, antepartum Continue routine care Pt has ringworm on right forearm, she is currently taking clortrimazole, pt advised to continue medication another 1-2 weeks.  If no resolution can consider change to micanazole topical or diflucan  5. History of preterm premature rupture of membranes (PPROM) Will start weekly makena for prevention of PROM or PTL - HYDROXYprogesterone caproate (Makena) autoinjector 275 mg  6. History of cryosurgery   7. Generalized anxiety disorder   8. Attention deficit hyperactivity disorder (ADHD), predominantly inattentive type   9. Alpha thalassemia silent carrier   10. [redacted] weeks gestation of pregnancy   Preterm labor symptoms and general obstetric precautions including but not limited to vaginal bleeding, contractions, leaking of fluid and fetal movement were reviewed in detail with the patient.  Please refer to After Visit Summary for other counseling recommendations.   Return in about 3 weeks (around 04/03/2021) for [facility], in person.   Mariel Aloe, MD Faculty Attending Center for [facility]

## 2021-03-19 ENCOUNTER — Encounter: Payer: Self-pay | Admitting: *Deleted

## 2021-03-19 ENCOUNTER — Ambulatory Visit: Payer: Medicaid Other | Attending: Obstetrics and Gynecology

## 2021-03-19 ENCOUNTER — Other Ambulatory Visit: Payer: Self-pay

## 2021-03-19 ENCOUNTER — Ambulatory Visit: Payer: Medicaid Other | Admitting: *Deleted

## 2021-03-19 DIAGNOSIS — O3432 Maternal care for cervical incompetence, second trimester: Secondary | ICD-10-CM

## 2021-03-19 DIAGNOSIS — Z9889 Other specified postprocedural states: Secondary | ICD-10-CM | POA: Insufficient documentation

## 2021-03-19 DIAGNOSIS — O0990 Supervision of high risk pregnancy, unspecified, unspecified trimester: Secondary | ICD-10-CM | POA: Diagnosis not present

## 2021-03-20 ENCOUNTER — Other Ambulatory Visit: Payer: Self-pay

## 2021-03-20 ENCOUNTER — Other Ambulatory Visit: Payer: Self-pay | Admitting: *Deleted

## 2021-03-20 ENCOUNTER — Telehealth: Payer: Self-pay

## 2021-03-20 ENCOUNTER — Ambulatory Visit (INDEPENDENT_AMBULATORY_CARE_PROVIDER_SITE_OTHER): Payer: Medicaid Other

## 2021-03-20 DIAGNOSIS — Z8751 Personal history of pre-term labor: Secondary | ICD-10-CM | POA: Diagnosis not present

## 2021-03-20 DIAGNOSIS — O3430 Maternal care for cervical incompetence, unspecified trimester: Secondary | ICD-10-CM

## 2021-03-20 DIAGNOSIS — Z8759 Personal history of other complications of pregnancy, childbirth and the puerperium: Secondary | ICD-10-CM

## 2021-03-20 DIAGNOSIS — O09892 Supervision of other high risk pregnancies, second trimester: Secondary | ICD-10-CM

## 2021-03-20 DIAGNOSIS — B49 Unspecified mycosis: Secondary | ICD-10-CM

## 2021-03-20 DIAGNOSIS — Z3A20 20 weeks gestation of pregnancy: Secondary | ICD-10-CM | POA: Diagnosis not present

## 2021-03-20 MED ORDER — FLUCONAZOLE 150 MG PO TABS
150.0000 mg | ORAL_TABLET | Freq: Once | ORAL | 0 refills | Status: DC
Start: 2021-03-20 — End: 2021-03-20

## 2021-03-20 MED ORDER — FLUCONAZOLE 150 MG PO TABS
150.0000 mg | ORAL_TABLET | Freq: Once | ORAL | 0 refills | Status: AC
Start: 2021-03-20 — End: 2021-03-20

## 2021-03-20 MED ORDER — HYDROXYPROGESTERONE CAPROATE 275 MG/1.1ML ~~LOC~~ SOAJ
275.0000 mg | Freq: Once | SUBCUTANEOUS | Status: AC
  Administered 2021-03-20: 275 mg via SUBCUTANEOUS

## 2021-03-20 NOTE — Telephone Encounter (Signed)
Received call from patient, she is requesting a refill for diflucan be sent back to her pharmacy. Refill sent again

## 2021-03-20 NOTE — Progress Notes (Signed)
Patient presents for 17p injection. Inj given in right arm. Patient tolerated well.  Patient also states that she continues to have issues with getting ring worms. Patient states that she has been using the otc clomitrozole cream for two weeks with minimal relief. Discussed with Dr. Debroah Loop. Per Dr. Debroah Loop okay to send diflucan 150 mg to pharmacy. Rx sent

## 2021-03-27 ENCOUNTER — Ambulatory Visit (INDEPENDENT_AMBULATORY_CARE_PROVIDER_SITE_OTHER): Payer: Medicaid Other

## 2021-03-27 ENCOUNTER — Other Ambulatory Visit: Payer: Self-pay

## 2021-03-27 DIAGNOSIS — Z8751 Personal history of pre-term labor: Secondary | ICD-10-CM

## 2021-03-27 DIAGNOSIS — Z8759 Personal history of other complications of pregnancy, childbirth and the puerperium: Secondary | ICD-10-CM

## 2021-03-27 MED ORDER — HYDROXYPROGESTERONE CAPROATE 275 MG/1.1ML ~~LOC~~ SOAJ
275.0000 mg | Freq: Once | SUBCUTANEOUS | Status: AC
  Administered 2021-03-27: 275 mg via SUBCUTANEOUS

## 2021-03-27 NOTE — Progress Notes (Signed)
ROB 21w  Presents for 17-P Injection today. Injection given w/o any problems today in left deltoid. Pt tolerated injection well.

## 2021-03-27 NOTE — Progress Notes (Signed)
Patient was assessed and managed by nursing staff during this encounter. I have reviewed the chart and agree with the documentation and plan. I have also made any necessary editorial changes.  Warden Fillers, MD 03/27/2021 6:38 PM

## 2021-04-03 ENCOUNTER — Other Ambulatory Visit: Payer: Self-pay

## 2021-04-03 ENCOUNTER — Ambulatory Visit (INDEPENDENT_AMBULATORY_CARE_PROVIDER_SITE_OTHER): Payer: Medicaid Other | Admitting: Obstetrics and Gynecology

## 2021-04-03 ENCOUNTER — Encounter: Payer: Self-pay | Admitting: Obstetrics and Gynecology

## 2021-04-03 VITALS — BP 126/79 | HR 88 | Wt 172.0 lb

## 2021-04-03 DIAGNOSIS — O3432 Maternal care for cervical incompetence, second trimester: Secondary | ICD-10-CM

## 2021-04-03 DIAGNOSIS — O0990 Supervision of high risk pregnancy, unspecified, unspecified trimester: Secondary | ICD-10-CM

## 2021-04-03 DIAGNOSIS — Z8751 Personal history of pre-term labor: Secondary | ICD-10-CM

## 2021-04-03 DIAGNOSIS — O4442 Low lying placenta NOS or without hemorrhage, second trimester: Secondary | ICD-10-CM

## 2021-04-03 DIAGNOSIS — Z9889 Other specified postprocedural states: Secondary | ICD-10-CM

## 2021-04-03 DIAGNOSIS — Z8742 Personal history of other diseases of the female genital tract: Secondary | ICD-10-CM

## 2021-04-03 MED ORDER — MICONAZOLE NITRATE 2 % EX CREA: 1.0000 "application " | TOPICAL_CREAM | Freq: Two times a day (BID) | CUTANEOUS | 0 refills | Status: DC

## 2021-04-03 MED ORDER — HYDROXYPROGESTERONE CAPROATE 275 MG/1.1ML ~~LOC~~ SOAJ
275.0000 mg | SUBCUTANEOUS | Status: AC
  Administered 2021-04-03 – 2021-07-04 (×9): 275 mg via SUBCUTANEOUS

## 2021-04-03 NOTE — Progress Notes (Signed)
   PRENATAL VISIT NOTE  Subjective:  Ruth Ellis is a 29 y.o. G6P0050 at [redacted]w[redacted]d being seen today for ongoing prenatal care.  She is currently monitored for the following issues for this high-risk pregnancy and has Generalized anxiety disorder; Attention deficit hyperactivity disorder (ADHD), predominantly inattentive type; Bereavement; History of preterm premature rupture of membranes (PPROM); Supervision of high risk pregnancy, antepartum; History of cryosurgery; Cervical insufficiency during pregnancy in second trimester, antepartum; Low lying placenta nos or without hemorrhage, second trimester; Alpha thalassemia silent carrier; Cervical cerclage suture present; and H/O cervical incompetence on their problem list.  Patient reports persistent ringworm issue.  Contractions: Irritability. Vag. Bleeding: None.  Movement: Present. Denies leaking of fluid.   The following portions of the patient's history were reviewed and updated as appropriate: allergies, current medications, past family history, past medical history, past social history, past surgical history and problem list.   Objective:   Vitals:   04/03/21 1529  BP: 126/79  Pulse: 88  Weight: 172 lb (78 kg)    Fetal Status: Fetal Heart Rate (bpm): 156 Fundal Height: 22 cm Movement: Present     General:  Alert, oriented and cooperative. Patient is in no acute distress.  Skin: Skin is warm and dry. No rash noted.   Cardiovascular: Normal heart rate noted  Respiratory: Normal respiratory effort, no problems with respiration noted  Abdomen: Soft, gravid, appropriate for gestational age.  Pain/Pressure: Absent     Pelvic: Cervical exam deferred        Extremities: Normal range of motion.  Edema: None  Mental Status: Normal mood and affect. Normal behavior. Normal judgment and thought content.   Assessment and Plan:  Pregnancy: G6P0050 at [redacted]w[redacted]d 1. Supervision of high risk pregnancy, antepartum Patient is doing well without  complaints Third trimester labs next visit with glucola  2. H/O cervical incompetence S/p cerclage Follow up cervical length Continue weekly 17-P  3. Low lying placenta nos or without hemorrhage, second trimester Resolved on recent scan   Preterm labor symptoms and general obstetric precautions including but not limited to vaginal bleeding, contractions, leaking of fluid and fetal movement were reviewed in detail with the patient. Please refer to After Visit Summary for other counseling recommendations.   Return in about 4 weeks (around 05/01/2021) for in person, ROB, High risk, 2 hr glucola next visit.  Future Appointments  Date Time Provider Department Center  04/05/2021  1:30 PM [facility] NURSE [facility]  04/05/2021  1:45 PM [facility] US6 [facility]  04/10/2021  3:00 PM [facility] NURSE [facility] None  04/17/2021  3:00 PM [facility] NURSE [facility] None    Catalina Antigua, MD

## 2021-04-03 NOTE — Progress Notes (Signed)
Reports continued problem with ringworm on right upper back.

## 2021-04-03 NOTE — Addendum Note (Signed)
Addended by: Harrel Lemon on: 04/03/2021 03:58 PM   Modules accepted: Orders

## 2021-04-05 ENCOUNTER — Ambulatory Visit: Payer: Medicaid Other | Attending: Obstetrics and Gynecology

## 2021-04-05 ENCOUNTER — Other Ambulatory Visit: Payer: Self-pay | Admitting: Obstetrics and Gynecology

## 2021-04-05 ENCOUNTER — Ambulatory Visit: Payer: Medicaid Other | Admitting: *Deleted

## 2021-04-05 ENCOUNTER — Encounter: Payer: Self-pay | Admitting: *Deleted

## 2021-04-05 ENCOUNTER — Other Ambulatory Visit: Payer: Self-pay

## 2021-04-05 DIAGNOSIS — O0990 Supervision of high risk pregnancy, unspecified, unspecified trimester: Secondary | ICD-10-CM | POA: Insufficient documentation

## 2021-04-05 DIAGNOSIS — Z9889 Other specified postprocedural states: Secondary | ICD-10-CM | POA: Insufficient documentation

## 2021-04-05 DIAGNOSIS — Z3686 Encounter for antenatal screening for cervical length: Secondary | ICD-10-CM

## 2021-04-05 DIAGNOSIS — O3432 Maternal care for cervical incompetence, second trimester: Secondary | ICD-10-CM | POA: Diagnosis present

## 2021-04-05 DIAGNOSIS — O2622 Pregnancy care for patient with recurrent pregnancy loss, second trimester: Secondary | ICD-10-CM

## 2021-04-05 DIAGNOSIS — Z148 Genetic carrier of other disease: Secondary | ICD-10-CM

## 2021-04-05 DIAGNOSIS — Z3A22 22 weeks gestation of pregnancy: Secondary | ICD-10-CM

## 2021-04-05 DIAGNOSIS — Z363 Encounter for antenatal screening for malformations: Secondary | ICD-10-CM

## 2021-04-05 DIAGNOSIS — O4442 Low lying placenta NOS or without hemorrhage, second trimester: Secondary | ICD-10-CM

## 2021-04-05 DIAGNOSIS — O3430 Maternal care for cervical incompetence, unspecified trimester: Secondary | ICD-10-CM | POA: Diagnosis not present

## 2021-04-10 ENCOUNTER — Other Ambulatory Visit: Payer: Self-pay | Admitting: *Deleted

## 2021-04-10 ENCOUNTER — Ambulatory Visit (INDEPENDENT_AMBULATORY_CARE_PROVIDER_SITE_OTHER): Payer: Medicaid Other

## 2021-04-10 ENCOUNTER — Other Ambulatory Visit: Payer: Self-pay

## 2021-04-10 DIAGNOSIS — O0990 Supervision of high risk pregnancy, unspecified, unspecified trimester: Secondary | ICD-10-CM

## 2021-04-10 DIAGNOSIS — Z8751 Personal history of pre-term labor: Secondary | ICD-10-CM

## 2021-04-10 DIAGNOSIS — Z8759 Personal history of other complications of pregnancy, childbirth and the puerperium: Secondary | ICD-10-CM

## 2021-04-10 DIAGNOSIS — O3432 Maternal care for cervical incompetence, second trimester: Secondary | ICD-10-CM

## 2021-04-10 NOTE — Progress Notes (Signed)
Subjective:  Ruth Ellis is a 30 y.o. female here for Makena Injection.  Objective:  BP 115/73   Pulse 85   Wt 170 lb (77.1 kg)   LMP 10/31/2020 (Exact Date)   BMI 28.29 kg/m   Appearance alert, well appearing, and in no distress. General exam BP noted to be well controlled today in office.   Plan:  17P Injection given in LA, tolerated well.  Return in 1 week for 17P Injection.  Administrations This Visit    HYDROXYprogesterone caproate (Makena) autoinjector 275 mg    Admin Date 04/10/2021 Action Given Dose 275 mg Route Subcutaneous Administered By Maretta Bees, RMA

## 2021-04-11 ENCOUNTER — Other Ambulatory Visit: Payer: Self-pay | Admitting: Obstetrics and Gynecology

## 2021-04-11 ENCOUNTER — Ambulatory Visit: Payer: Medicaid Other | Admitting: *Deleted

## 2021-04-11 ENCOUNTER — Encounter: Payer: Self-pay | Admitting: *Deleted

## 2021-04-11 ENCOUNTER — Ambulatory Visit: Payer: Medicaid Other | Attending: Obstetrics and Gynecology

## 2021-04-11 DIAGNOSIS — O0990 Supervision of high risk pregnancy, unspecified, unspecified trimester: Secondary | ICD-10-CM | POA: Diagnosis present

## 2021-04-11 DIAGNOSIS — Z9889 Other specified postprocedural states: Secondary | ICD-10-CM

## 2021-04-11 DIAGNOSIS — Z3A23 23 weeks gestation of pregnancy: Secondary | ICD-10-CM

## 2021-04-11 DIAGNOSIS — O2622 Pregnancy care for patient with recurrent pregnancy loss, second trimester: Secondary | ICD-10-CM

## 2021-04-11 DIAGNOSIS — O3432 Maternal care for cervical incompetence, second trimester: Secondary | ICD-10-CM

## 2021-04-11 DIAGNOSIS — Z363 Encounter for antenatal screening for malformations: Secondary | ICD-10-CM

## 2021-04-11 DIAGNOSIS — O4442 Low lying placenta NOS or without hemorrhage, second trimester: Secondary | ICD-10-CM

## 2021-04-11 DIAGNOSIS — Z3686 Encounter for antenatal screening for cervical length: Secondary | ICD-10-CM

## 2021-04-11 DIAGNOSIS — Z148 Genetic carrier of other disease: Secondary | ICD-10-CM

## 2021-04-17 ENCOUNTER — Other Ambulatory Visit: Payer: Self-pay

## 2021-04-17 ENCOUNTER — Ambulatory Visit (INDEPENDENT_AMBULATORY_CARE_PROVIDER_SITE_OTHER): Payer: Medicaid Other

## 2021-04-17 DIAGNOSIS — Z8759 Personal history of other complications of pregnancy, childbirth and the puerperium: Secondary | ICD-10-CM

## 2021-04-17 DIAGNOSIS — Z8751 Personal history of pre-term labor: Secondary | ICD-10-CM | POA: Diagnosis not present

## 2021-04-17 MED ORDER — HYDROXYPROGESTERONE CAPROATE 275 MG/1.1ML ~~LOC~~ SOAJ
275.0000 mg | Freq: Once | SUBCUTANEOUS | Status: AC
  Administered 2021-04-17: 275 mg via SUBCUTANEOUS

## 2021-04-17 NOTE — Progress Notes (Signed)
Agree with A & P. 

## 2021-04-17 NOTE — Progress Notes (Signed)
ROB presents for 17-P injection    Pt supplied Exp:04/2022 Pt tolerated Injection well in Rt arm Advised to make next appt.

## 2021-04-18 ENCOUNTER — Telehealth: Payer: Self-pay

## 2021-04-18 NOTE — Telephone Encounter (Signed)
Pt returned call, advised to reach out to case worker due to having [place] medicaid, advised that pharmacy is unable to send Makena refills until then, pt agreed.

## 2021-04-18 NOTE — Telephone Encounter (Signed)
Called pt to advise that [facility] pharmacy is unable to fill Makena injections due to insurance other than Medicaid showing. No answer, left vm to call

## 2021-04-24 ENCOUNTER — Other Ambulatory Visit: Payer: Self-pay

## 2021-04-24 ENCOUNTER — Ambulatory Visit (INDEPENDENT_AMBULATORY_CARE_PROVIDER_SITE_OTHER): Payer: BC Managed Care – PPO | Admitting: *Deleted

## 2021-04-24 VITALS — BP 136/83 | HR 86

## 2021-04-24 DIAGNOSIS — Z8751 Personal history of pre-term labor: Secondary | ICD-10-CM

## 2021-04-24 DIAGNOSIS — O0990 Supervision of high risk pregnancy, unspecified, unspecified trimester: Secondary | ICD-10-CM

## 2021-04-24 NOTE — Progress Notes (Signed)
Pt is in office for 17p injection. Pt tolerated injection well.  Pt states she has been in touch with case manager to correct Ins coverage to [place] [employer].  Pt to let office know when it is corrected.   Administrations This Visit     HYDROXYprogesterone caproate (Makena) autoinjector 275 mg     Admin Date 04/24/2021 Action Given Dose 275 mg Route Subcutaneous Administered By Lanney Gins, CMA

## 2021-05-01 ENCOUNTER — Other Ambulatory Visit: Payer: Self-pay

## 2021-05-01 ENCOUNTER — Encounter: Payer: Self-pay | Admitting: Obstetrics and Gynecology

## 2021-05-01 ENCOUNTER — Ambulatory Visit (INDEPENDENT_AMBULATORY_CARE_PROVIDER_SITE_OTHER): Payer: Medicaid Other | Admitting: Obstetrics and Gynecology

## 2021-05-01 ENCOUNTER — Ambulatory Visit: Payer: Medicaid Other

## 2021-05-01 ENCOUNTER — Other Ambulatory Visit: Payer: Medicaid Other

## 2021-05-01 VITALS — BP 123/76 | HR 92 | Wt 173.9 lb

## 2021-05-01 DIAGNOSIS — Z23 Encounter for immunization: Secondary | ICD-10-CM

## 2021-05-01 DIAGNOSIS — Z8751 Personal history of pre-term labor: Secondary | ICD-10-CM

## 2021-05-01 DIAGNOSIS — O0990 Supervision of high risk pregnancy, unspecified, unspecified trimester: Secondary | ICD-10-CM

## 2021-05-01 DIAGNOSIS — O3432 Maternal care for cervical incompetence, second trimester: Secondary | ICD-10-CM

## 2021-05-01 DIAGNOSIS — O4442 Low lying placenta NOS or without hemorrhage, second trimester: Secondary | ICD-10-CM

## 2021-05-01 DIAGNOSIS — Z8742 Personal history of other diseases of the female genital tract: Secondary | ICD-10-CM

## 2021-05-01 DIAGNOSIS — Z8759 Personal history of other complications of pregnancy, childbirth and the puerperium: Secondary | ICD-10-CM

## 2021-05-01 MED ORDER — HYDROXYPROGESTERONE CAPROATE 250 MG/ML IM OIL
250.0000 mg | TOPICAL_OIL | Freq: Once | INTRAMUSCULAR | Status: AC
  Administered 2021-05-01: 250 mg via INTRAMUSCULAR

## 2021-05-01 NOTE — Patient Instructions (Signed)

## 2021-05-01 NOTE — Progress Notes (Addendum)
+   Fetal movement. Pt would like to discuss when cerclage will be removed. PHQ 9: 3, GAD 7: 3  Makena 250mg  given IM LUOQ. Pt tolerated well with no adverse side effects. Pt to return in one week for repeat injection.

## 2021-05-01 NOTE — Progress Notes (Signed)
Subjective:  Quantina Dershem is a 30 y.o. G6P0050 at [redacted]w[redacted]d being seen today for ongoing prenatal care.  She is currently monitored for the following issues for this high-risk pregnancy and has Generalized anxiety disorder; Attention deficit hyperactivity disorder (ADHD), predominantly inattentive type; Bereavement; History of preterm premature rupture of membranes (PPROM); Supervision of high risk pregnancy, antepartum; History of cryosurgery; Cervical insufficiency during pregnancy in second trimester, antepartum; Low lying placenta nos or without hemorrhage, second trimester; Alpha thalassemia silent carrier; Cervical cerclage suture present; and H/O cervical incompetence on their problem list.  Patient reports no complaints.  Contractions: Not present. Vag. Bleeding: None.  Movement: Present. Denies leaking of fluid.   The following portions of the patient's history were reviewed and updated as appropriate: allergies, current medications, past family history, past medical history, past social history, past surgical history and problem list. Problem list updated.  Objective:   Vitals:   05/01/21 0854  BP: 123/76  Pulse: 92  Weight: 173 lb 14.4 oz (78.9 kg)    Fetal Status: Fetal Heart Rate (bpm): 151   Movement: Present     General:  Alert, oriented and cooperative. Patient is in no acute distress.  Skin: Skin is warm and dry. No rash noted.   Cardiovascular: Normal heart rate noted  Respiratory: Normal respiratory effort, no problems with respiration noted  Abdomen: Soft, gravid, appropriate for gestational age. Pain/Pressure: Absent     Pelvic:  Cervical exam deferred        Extremities: Normal range of motion.  Edema: None  Mental Status: Normal mood and affect. Normal behavior. Normal judgment and thought content.   Urinalysis:      Assessment and Plan:  Pregnancy: G6P0050 at [redacted]w[redacted]d  1. Supervision of high risk pregnancy, antepartum Stable - Glucose Tolerance, 2 Hours w/1  Hour - RPR - CBC - HIV antibody (with reflex) - Tdap vaccine greater than or equal to 7yo IM  2. H/O cervical incompetence Cerclage in place No more CL per MFM  3. Cervical cerclage suture present in second trimester See above  4. History of preterm premature rupture of membranes (PPROM) Stable Continue with 17 OHP Spoke with MFM, vaginal progesterone not needed since on 17 OHP  5. Cervical insufficiency during pregnancy in second trimester, antepartum See above  6. Low lying placenta nos or without hemorrhage, second trimester Resolved   Preterm labor symptoms and general obstetric precautions including but not limited to vaginal bleeding, contractions, leaking of fluid and fetal movement were reviewed in detail with the patient. Please refer to After Visit Summary for other counseling recommendations.  Return in about 2 weeks (around 05/15/2021) for OB visit, face to face, MD only.   Hermina Staggers, MD

## 2021-05-01 NOTE — Addendum Note (Signed)
Addended by: Charlsie Quest B on: 05/01/2021 09:38 AM   Modules accepted: Orders

## 2021-05-02 ENCOUNTER — Encounter: Payer: Self-pay | Admitting: Obstetrics and Gynecology

## 2021-05-02 ENCOUNTER — Other Ambulatory Visit: Payer: Self-pay

## 2021-05-02 DIAGNOSIS — O24410 Gestational diabetes mellitus in pregnancy, diet controlled: Secondary | ICD-10-CM

## 2021-05-02 DIAGNOSIS — O24419 Gestational diabetes mellitus in pregnancy, unspecified control: Secondary | ICD-10-CM | POA: Insufficient documentation

## 2021-05-02 LAB — CBC
Hematocrit: 31.7 % — ABNORMAL LOW (ref 34.0–46.6)
Hemoglobin: 10.8 g/dL — ABNORMAL LOW (ref 11.1–15.9)
MCH: 29.3 pg (ref 26.6–33.0)
MCHC: 34.1 g/dL (ref 31.5–35.7)
MCV: 86 fL (ref 79–97)
Platelets: 320 10*3/uL (ref 150–450)
RBC: 3.68 x10E6/uL — ABNORMAL LOW (ref 3.77–5.28)
RDW: 11.8 % (ref 11.7–15.4)
WBC: 12.6 10*3/uL — ABNORMAL HIGH (ref 3.4–10.8)

## 2021-05-02 LAB — RPR: RPR Ser Ql: NONREACTIVE

## 2021-05-02 LAB — GLUCOSE TOLERANCE, 2 HOURS W/ 1HR
Glucose, 1 hour: 205 mg/dL — ABNORMAL HIGH (ref 65–179)
Glucose, 2 hour: 149 mg/dL (ref 65–152)
Glucose, Fasting: 106 mg/dL — ABNORMAL HIGH (ref 65–91)

## 2021-05-02 LAB — HIV ANTIBODY (ROUTINE TESTING W REFLEX): HIV Screen 4th Generation wRfx: NONREACTIVE

## 2021-05-02 MED ORDER — ACCU-CHEK SOFTCLIX LANCETS MISC: 12 refills | Status: DC

## 2021-05-02 MED ORDER — ACCU-CHEK GUIDE VI STRP: ORAL_STRIP | 12 refills | Status: DC

## 2021-05-02 MED ORDER — ACCU-CHEK GUIDE W/DEVICE KIT: PACK | 0 refills | Status: DC

## 2021-05-03 ENCOUNTER — Telehealth: Payer: Self-pay

## 2021-05-03 NOTE — Telephone Encounter (Signed)
I called patient to review diabetes results with her. Patient advised to check blood sugars 4 times a day. Fasting and 2 hours after every meal (breakfast, lunch, and dinner). Patient informed that she will be contacted about her appointment to diabetic educations. Advised to document her readings and to bring with her to every appointment.   Patient states that she is aware of how to check blood sugars due to her mother being diabetic. Informed her that education will go over this information more in depth.  Patient verbalized understanding.

## 2021-05-08 ENCOUNTER — Other Ambulatory Visit: Payer: Self-pay

## 2021-05-08 ENCOUNTER — Ambulatory Visit (INDEPENDENT_AMBULATORY_CARE_PROVIDER_SITE_OTHER): Payer: BC Managed Care – PPO

## 2021-05-08 ENCOUNTER — Ambulatory Visit: Payer: Medicaid Other

## 2021-05-08 DIAGNOSIS — Z8759 Personal history of other complications of pregnancy, childbirth and the puerperium: Secondary | ICD-10-CM

## 2021-05-08 DIAGNOSIS — Z8751 Personal history of pre-term labor: Secondary | ICD-10-CM

## 2021-05-08 MED ORDER — HYDROXYPROGESTERONE CAPROATE 250 MG/ML IM OIL
250.0000 mg | TOPICAL_OIL | Freq: Once | INTRAMUSCULAR | Status: AC
  Administered 2021-05-08: 250 mg via INTRAMUSCULAR

## 2021-05-08 NOTE — Progress Notes (Signed)
Makena 250mg  given IM RUOQ. Pt tolerated well with no adverse side effects noted. Pt to return in one week for repeat injection.

## 2021-05-09 ENCOUNTER — Encounter: Payer: BC Managed Care – PPO | Attending: Obstetrics and Gynecology | Admitting: Registered"

## 2021-05-09 ENCOUNTER — Other Ambulatory Visit: Payer: Self-pay

## 2021-05-09 DIAGNOSIS — O24419 Gestational diabetes mellitus in pregnancy, unspecified control: Secondary | ICD-10-CM | POA: Diagnosis not present

## 2021-05-11 ENCOUNTER — Telehealth: Payer: Self-pay

## 2021-05-11 NOTE — Telephone Encounter (Signed)
TC to [facility] regarding not having pt INS info  I provided Ins info as well as member services/pharmacy services numbers that are on the back of the card Per rep that is all that they need and will work on getting pt Rx sent

## 2021-05-13 ENCOUNTER — Encounter: Payer: Self-pay | Admitting: Registered"

## 2021-05-13 NOTE — Progress Notes (Signed)
Patient was seen on 05/09/21 for Gestational Diabetes self-management class at [facility]. The following learning objectives were met by the patient during this course:  States the definition of Gestational Diabetes States why dietary management is important in controlling blood glucose Describes the effects each nutrient has on blood glucose levels Demonstrates ability to create a balanced meal plan Demonstrates carbohydrate counting  States when to check blood glucose levels Demonstrates proper blood glucose monitoring techniques States the effect of stress and exercise on blood glucose levels States the importance of limiting caffeine and abstaining from alcohol and smoking  Blood glucose monitor given: Patient has meter and is checking blood sugar prior to class  Patient instructed to monitor glucose levels: FBS: 60 - <95; 1 hour: <140; 2 hour: <120  Patient received handouts: Nutrition Diabetes and Pregnancy, including carb counting list  Patient will be seen for follow-up as needed.

## 2021-05-15 ENCOUNTER — Ambulatory Visit: Payer: Medicaid Other

## 2021-05-15 ENCOUNTER — Ambulatory Visit (INDEPENDENT_AMBULATORY_CARE_PROVIDER_SITE_OTHER): Payer: BC Managed Care – PPO | Admitting: Obstetrics and Gynecology

## 2021-05-15 ENCOUNTER — Other Ambulatory Visit: Payer: Self-pay

## 2021-05-15 ENCOUNTER — Encounter: Payer: Self-pay | Admitting: Obstetrics and Gynecology

## 2021-05-15 VITALS — BP 132/80 | HR 103 | Wt 174.0 lb

## 2021-05-15 DIAGNOSIS — Z8742 Personal history of other diseases of the female genital tract: Secondary | ICD-10-CM

## 2021-05-15 DIAGNOSIS — O0990 Supervision of high risk pregnancy, unspecified, unspecified trimester: Secondary | ICD-10-CM

## 2021-05-15 DIAGNOSIS — O24410 Gestational diabetes mellitus in pregnancy, diet controlled: Secondary | ICD-10-CM | POA: Diagnosis not present

## 2021-05-15 DIAGNOSIS — Z8751 Personal history of pre-term labor: Secondary | ICD-10-CM | POA: Diagnosis not present

## 2021-05-15 DIAGNOSIS — Z3A28 28 weeks gestation of pregnancy: Secondary | ICD-10-CM | POA: Diagnosis not present

## 2021-05-15 DIAGNOSIS — Z8759 Personal history of other complications of pregnancy, childbirth and the puerperium: Secondary | ICD-10-CM

## 2021-05-15 DIAGNOSIS — O3433 Maternal care for cervical incompetence, third trimester: Secondary | ICD-10-CM

## 2021-05-15 MED ORDER — HYDROXYPROGESTERONE CAPROATE 275 MG/1.1ML ~~LOC~~ SOAJ
275.0000 mg | Freq: Once | SUBCUTANEOUS | Status: AC
  Administered 2021-05-15: 275 mg via SUBCUTANEOUS

## 2021-05-15 NOTE — Progress Notes (Signed)
   PRENATAL VISIT NOTE  Subjective:  Ruth Ellis is a 30 y.o. G6P0050 at [redacted]w[redacted]d being seen today for ongoing prenatal care.  She is currently monitored for the following issues for this high-risk pregnancy and has Generalized anxiety disorder; Attention deficit hyperactivity disorder (ADHD), predominantly inattentive type; Bereavement; History of preterm premature rupture of membranes (PPROM); Supervision of high risk pregnancy, antepartum; History of cryosurgery; Cervical insufficiency during pregnancy in second trimester, antepartum; Low lying placenta nos or without hemorrhage, second trimester; Alpha thalassemia silent carrier; Cervical cerclage suture present; H/O cervical incompetence; Gestational diabetes; and [redacted] weeks gestation of pregnancy on their problem list.  Patient doing well with no acute concerns today. She reports no complaints.  Contractions: Irritability. Vag. Bleeding: None.  Movement: Present. Denies leaking of fluid.   The following portions of the patient's history were reviewed and updated as appropriate: allergies, current medications, past family history, past medical history, past social history, past surgical history and problem list. Problem list updated.  Objective:   Vitals:   05/15/21 1425  BP: 132/80  Pulse: (!) 103  Weight: 174 lb (78.9 kg)    Fetal Status: Fetal Heart Rate (bpm): nst   Movement: Present    NST: baseline 158-160, moderate variability with accels noted, no decels, category 1 strip General:  Alert, oriented and cooperative. Patient is in no acute distress.  Skin: Skin is warm and dry. No rash noted.   Cardiovascular: Normal heart rate noted  Respiratory: Normal respiratory effort, no problems with respiration noted  Abdomen: Soft, gravid, appropriate for gestational age.  Pain/Pressure: Absent     Pelvic: Cervical exam deferred        Extremities: Normal range of motion.  Edema: None  Mental Status:  Normal mood and affect. Normal  behavior. Normal judgment and thought content.   Assessment and Plan:  Pregnancy: G6P0050 at [redacted]w[redacted]d  1. History of preterm premature rupture of membranes (PPROM) Continue weekly makena - HYDROXYprogesterone caproate (Makena) autoinjector 275 mg  2. Supervision of high risk pregnancy, antepartum Continue routine care  3. [redacted] weeks gestation of pregnancy   4. H/O cervical incompetence Cerclage in place, no s/sx of preterm labor  5. Diet controlled gestational diabetes mellitus (GDM) in third trimester FBS: 82-114 with 7/12 out of range PPBS 94-153, 10/33 out of range  Pt advance to follow diet closer, if no improvement, especially with fasting values, will need to add at least night time metformin  6. Cervical cerclage suture present in third trimester Remove at 36 weeks, discuss with MFM, vaginal progesterone along with makena.  Preterm labor symptoms and general obstetric precautions including but not limited to vaginal bleeding, contractions, leaking of fluid and fetal movement were reviewed in detail with the patient.  Please refer to After Visit Summary for other counseling recommendations.   Return in about 2 weeks (around 05/29/2021) for [facility], in person.   Mariel Aloe, MD Faculty Attending Center for [facility]

## 2021-05-15 NOTE — Progress Notes (Signed)
ROB  28w 17P  Pt states she wants to discuss blood sugar readings. pt states sugars range from 103-159.

## 2021-05-22 ENCOUNTER — Other Ambulatory Visit: Payer: Self-pay

## 2021-05-22 ENCOUNTER — Ambulatory Visit (INDEPENDENT_AMBULATORY_CARE_PROVIDER_SITE_OTHER): Payer: BC Managed Care – PPO

## 2021-05-22 DIAGNOSIS — O3432 Maternal care for cervical incompetence, second trimester: Secondary | ICD-10-CM

## 2021-05-22 DIAGNOSIS — Z8759 Personal history of other complications of pregnancy, childbirth and the puerperium: Secondary | ICD-10-CM

## 2021-05-22 DIAGNOSIS — Z8751 Personal history of pre-term labor: Secondary | ICD-10-CM | POA: Diagnosis not present

## 2021-05-22 NOTE — Progress Notes (Addendum)
SUBJECTIVE: Ruth Ellis is a 30 y.o. OB [redacted]w[redacted]d who presents for Makena Injection.  ASSESSMENT: Hx of PPROM  PLAN:  17P Injection given in LA, tolerated well.  Next Injection due in 1 week 05/29/21  Administrations This Visit     HYDROXYprogesterone caproate (Makena) autoinjector 275 mg     Admin Date 05/22/2021 Action Given Dose 275 mg Route Subcutaneous Administered By Maretta Bees, RMA

## 2021-05-22 NOTE — Progress Notes (Signed)
Patient was assessed and managed by nursing staff during this encounter. I have reviewed the chart and agree with the documentation and plan. I have also made any necessary editorial changes.  Catalina Antigua, MD 05/22/2021 6:10 PM

## 2021-05-25 ENCOUNTER — Telehealth: Payer: Self-pay

## 2021-05-25 ENCOUNTER — Other Ambulatory Visit: Payer: Self-pay | Admitting: Obstetrics and Gynecology

## 2021-05-25 MED ORDER — METFORMIN HCL 500 MG PO TABS: 500.0000 mg | ORAL_TABLET | Freq: Every day | ORAL | 5 refills | Status: DC

## 2021-05-25 NOTE — Telephone Encounter (Signed)
Pt called to report FBS 102-107.  She said MD informed her last visit she may need to start Metformin if FBS does not stay 95-100. She reports following the diabetic diet.  PP readings have been good this week 94-135 per pt.   Consulted with MD, pt to start Metformin 500 mg at bedtime. She still needs to continue with her diabetic diet. She needs to ensure that she is not eating or drinking in the middle of the night. It needs to be a true 8-9 hour fast- not longer than that. Pt agrees with the plan and we will follow up next week during her appt.

## 2021-05-29 ENCOUNTER — Ambulatory Visit: Payer: Medicaid Other

## 2021-05-29 ENCOUNTER — Telehealth: Payer: Self-pay

## 2021-05-29 ENCOUNTER — Other Ambulatory Visit: Payer: Self-pay

## 2021-05-29 ENCOUNTER — Ambulatory Visit (INDEPENDENT_AMBULATORY_CARE_PROVIDER_SITE_OTHER): Payer: Medicaid Other | Admitting: Obstetrics and Gynecology

## 2021-05-29 VITALS — BP 132/81 | HR 97 | Wt 175.0 lb

## 2021-05-29 DIAGNOSIS — O0990 Supervision of high risk pregnancy, unspecified, unspecified trimester: Secondary | ICD-10-CM

## 2021-05-29 DIAGNOSIS — Z8751 Personal history of pre-term labor: Secondary | ICD-10-CM | POA: Diagnosis not present

## 2021-05-29 DIAGNOSIS — Z8742 Personal history of other diseases of the female genital tract: Secondary | ICD-10-CM

## 2021-05-29 DIAGNOSIS — Z8759 Personal history of other complications of pregnancy, childbirth and the puerperium: Secondary | ICD-10-CM

## 2021-05-29 DIAGNOSIS — D563 Thalassemia minor: Secondary | ICD-10-CM

## 2021-05-29 DIAGNOSIS — Z3A30 30 weeks gestation of pregnancy: Secondary | ICD-10-CM

## 2021-05-29 DIAGNOSIS — O24415 Gestational diabetes mellitus in pregnancy, controlled by oral hypoglycemic drugs: Secondary | ICD-10-CM

## 2021-05-29 DIAGNOSIS — O3433 Maternal care for cervical incompetence, third trimester: Secondary | ICD-10-CM

## 2021-05-29 NOTE — Progress Notes (Signed)
+   Fetal movement. Pt has blood glucose logs to review.   Makena 275mg /1.34mL given Right arm. Pt tolerated well. No adverse side effects notes. Pt to return in 1 week for repeat injection.

## 2021-05-29 NOTE — Progress Notes (Signed)
   PRENATAL VISIT NOTE  Subjective:  Ruth Ellis is a 30 y.o. G6P0050 at [redacted]w[redacted]d being seen today for ongoing prenatal care.  She is currently monitored for the following issues for this high-risk pregnancy and has Generalized anxiety disorder; Attention deficit hyperactivity disorder (ADHD), predominantly inattentive type; Bereavement; History of preterm premature rupture of membranes (PPROM); Supervision of high risk pregnancy, antepartum; History of cryosurgery; Cervical insufficiency during pregnancy in second trimester, antepartum; Low lying placenta nos or without hemorrhage, second trimester; Alpha thalassemia silent carrier; Cervical cerclage suture present; H/O cervical incompetence; Gestational diabetes; [redacted] weeks gestation of pregnancy; and [redacted] weeks gestation of pregnancy on their problem list.  Patient doing well with no acute concerns today. She reports no complaints.  Contractions: Irritability. Vag. Bleeding: None.  Movement: Present. Denies leaking of fluid.    Pt recently started on metformin.  Blood sugars already improving.   FBS:93-102 PPBS: 85-126, most within range She takes metformin only in the evening  The following portions of the patient's history were reviewed and updated as appropriate: allergies, current medications, past family history, past medical history, past social history, past surgical history and problem list. Problem list updated.  Objective:   Vitals:   05/29/21 1601  BP: 132/81  Pulse: 97  Weight: 175 lb (79.4 kg)    Fetal Status: Fetal Heart Rate (bpm): 150   Movement: Present     General:  Alert, oriented and cooperative. Patient is in no acute distress.  Skin: Skin is warm and dry. No rash noted.   Cardiovascular: Normal heart rate noted  Respiratory: Normal respiratory effort, no problems with respiration noted  Abdomen: Soft, gravid, appropriate for gestational age.  Pain/Pressure: Absent     Pelvic: Cervical exam deferred         Extremities: Normal range of motion.  Edema: None  Mental Status:  Normal mood and affect. Normal behavior. Normal judgment and thought content.   Assessment and Plan:  Pregnancy: G6P0050 at [redacted]w[redacted]d  1. [redacted] weeks gestation of pregnancy   2. H/O cervical incompetence   3. Cervical cerclage suture present in third trimester No s/sx of PTL or cerclage failure  4. Supervision of high risk pregnancy, antepartum Continue routine care  5. History of preterm premature rupture of membranes (PPROM) Continue weekly makena  6. Alpha thalassemia silent carrier   7. Gestational diabetes mellitus (GDM) in third trimester controlled on oral hypoglycemic drug Growth scan at [redacted] weeks along with initiation of weekly BPP/testing Pt to continue to record blood sugars - [place] MFM OB FOLLOW UP; Future  Preterm labor symptoms and general obstetric precautions including but not limited to vaginal bleeding, contractions, leaking of fluid and fetal movement were reviewed in detail with the patient.  Please refer to After Visit Summary for other counseling recommendations.   Return in about 2 weeks (around 06/12/2021) for [facility], in person.   Mariel Aloe, MD Faculty Attending Center for [facility]

## 2021-05-29 NOTE — Telephone Encounter (Signed)
I contacted [employer] and spoke with Clifton Custard a Pharmacist who states that Medicaid is still showing that she has a Editor, commissioning and that patient needs to contact her Child psychotherapist.

## 2021-06-05 ENCOUNTER — Other Ambulatory Visit: Payer: Self-pay

## 2021-06-05 ENCOUNTER — Ambulatory Visit (INDEPENDENT_AMBULATORY_CARE_PROVIDER_SITE_OTHER): Payer: 59

## 2021-06-05 DIAGNOSIS — Z8759 Personal history of other complications of pregnancy, childbirth and the puerperium: Secondary | ICD-10-CM

## 2021-06-05 DIAGNOSIS — Z8751 Personal history of pre-term labor: Secondary | ICD-10-CM

## 2021-06-05 DIAGNOSIS — Z8742 Personal history of other diseases of the female genital tract: Secondary | ICD-10-CM

## 2021-06-05 NOTE — Progress Notes (Signed)
SUBJECTIVE: Ruth Ellis is a 30 y.o. female who presents for Makena Injection.  OBJECTIVE: Appears well, in no apparent distress.  Vital signs are normal.   ASSESSMENT: Hx of PPROM  PLAN: 17P Injection given in LA, tolerated well.  Next 17P in 1 Week.  Patient will call Medicaid today to sort out issues with her Insurance as the [employer] is now terminated.  Administrations This Visit     HYDROXYprogesterone caproate (Makena) autoinjector 275 mg     Admin Date 06/05/2021 Action Given Dose 275 mg Route Subcutaneous Administered By Maretta Bees, RMA

## 2021-06-12 ENCOUNTER — Encounter: Payer: Self-pay | Admitting: Obstetrics and Gynecology

## 2021-06-12 ENCOUNTER — Ambulatory Visit (INDEPENDENT_AMBULATORY_CARE_PROVIDER_SITE_OTHER): Payer: Medicaid Other | Admitting: Obstetrics and Gynecology

## 2021-06-12 ENCOUNTER — Other Ambulatory Visit: Payer: Self-pay

## 2021-06-12 VITALS — BP 128/86 | HR 101 | Wt 178.0 lb

## 2021-06-12 DIAGNOSIS — O24415 Gestational diabetes mellitus in pregnancy, controlled by oral hypoglycemic drugs: Secondary | ICD-10-CM

## 2021-06-12 DIAGNOSIS — O3433 Maternal care for cervical incompetence, third trimester: Secondary | ICD-10-CM

## 2021-06-12 DIAGNOSIS — Z8751 Personal history of pre-term labor: Secondary | ICD-10-CM | POA: Diagnosis not present

## 2021-06-12 DIAGNOSIS — Z8759 Personal history of other complications of pregnancy, childbirth and the puerperium: Secondary | ICD-10-CM

## 2021-06-12 DIAGNOSIS — O0993 Supervision of high risk pregnancy, unspecified, third trimester: Secondary | ICD-10-CM

## 2021-06-12 DIAGNOSIS — Z8742 Personal history of other diseases of the female genital tract: Secondary | ICD-10-CM

## 2021-06-12 DIAGNOSIS — D563 Thalassemia minor: Secondary | ICD-10-CM

## 2021-06-12 DIAGNOSIS — Z3A32 32 weeks gestation of pregnancy: Secondary | ICD-10-CM | POA: Insufficient documentation

## 2021-06-12 DIAGNOSIS — O0990 Supervision of high risk pregnancy, unspecified, unspecified trimester: Secondary | ICD-10-CM

## 2021-06-12 MED ORDER — HYDROXYPROGESTERONE CAPROATE 275 MG/1.1ML ~~LOC~~ SOAJ
275.0000 mg | Freq: Once | SUBCUTANEOUS | Status: AC
  Administered 2021-06-12: 275 mg via SUBCUTANEOUS

## 2021-06-12 NOTE — Progress Notes (Signed)
   PRENATAL VISIT NOTE  Subjective:  Ruth Ellis is a 30 y.o. G6P0050 at [redacted]w[redacted]d being seen today for ongoing prenatal care.  She is currently monitored for the following issues for this high-risk pregnancy and has Generalized anxiety disorder; Attention deficit hyperactivity disorder (ADHD), predominantly inattentive type; Bereavement; History of preterm premature rupture of membranes (PPROM); Supervision of high risk pregnancy, antepartum; History of cryosurgery; Cervical insufficiency during pregnancy in second trimester, antepartum; Low lying placenta nos or without hemorrhage, second trimester; Alpha thalassemia silent carrier; Cervical cerclage suture present; H/O cervical incompetence; Gestational diabetes; [redacted] weeks gestation of pregnancy; [redacted] weeks gestation of pregnancy; and [redacted] weeks gestation of pregnancy on their problem list.  Patient doing well with no acute concerns today. She reports no complaints.  Contractions: Irritability. Vag. Bleeding: None.  Movement: Present. Denies leaking of fluid.   The following portions of the patient's history were reviewed and updated as appropriate: allergies, current medications, past family history, past medical history, past social history, past surgical history and problem list. Problem list updated.  Objective:   Vitals:   06/12/21 1434  BP: 128/86  Pulse: (!) 101  Weight: 178 lb (80.7 kg)    Fetal Status: Fetal Heart Rate (bpm): 146 Fundal Height: 33 cm Movement: Present     General:  Alert, oriented and cooperative. Patient is in no acute distress.  Skin: Skin is warm and dry. No rash noted.   Cardiovascular: Normal heart rate noted  Respiratory: Normal respiratory effort, no problems with respiration noted  Abdomen: Soft, gravid, appropriate for gestational age.  Pain/Pressure: Present     Pelvic: Cervical exam deferred        Extremities: Normal range of motion.  Edema: Trace  Mental Status:  Normal mood and affect. Normal behavior.  Normal judgment and thought content.   Assessment and Plan:  Pregnancy: G6P0050 at [redacted]w[redacted]d  1. Supervision of high risk pregnancy, antepartum Continue routine care, start weekly BPP this week  2. History of preterm premature rupture of membranes (PPROM) Continue with makena inj. No s/sx of PTL - HYDROXYprogesterone caproate (Makena) autoinjector 275 mg  3. [redacted] weeks gestation of pregnancy   4. H/O cervical incompetence   5. Cervical cerclage suture present in third trimester No report of cerclage issues  6. Alpha thalassemia silent carrier   7. Gestational diabetes mellitus (GDM) in third trimester controlled on oral hypoglycemic drug FBS: 99-124 PPBS: 94-164  Will increase metformin to 1000 mg BID with meals Will recheck in 1 weeks, if numbers still out of range will transition to insulin - US FETAL BPP W/NONSTRESS; Future  Preterm labor symptoms and general obstetric precautions including but not limited to vaginal bleeding, contractions, leaking of fluid and fetal movement were reviewed in detail with the patient.  Please refer to After Visit Summary for other counseling recommendations.   Return in about 1 week (around 06/19/2021) for [facility], in person.   Mariel Aloe, MD Faculty Attending Center for [facility]

## 2021-06-12 NOTE — Progress Notes (Signed)
ROB 32w  17-P.today.  CC: Noted HA's last week. None this week.

## 2021-06-14 ENCOUNTER — Other Ambulatory Visit: Payer: Self-pay

## 2021-06-14 ENCOUNTER — Ambulatory Visit (INDEPENDENT_AMBULATORY_CARE_PROVIDER_SITE_OTHER): Payer: Medicaid Other

## 2021-06-14 ENCOUNTER — Ambulatory Visit (INDEPENDENT_AMBULATORY_CARE_PROVIDER_SITE_OTHER): Payer: Medicaid Other | Admitting: Family Medicine

## 2021-06-14 ENCOUNTER — Other Ambulatory Visit (HOSPITAL_COMMUNITY)
Admission: RE | Admit: 2021-06-14 | Discharge: 2021-06-14 | Disposition: A | Discharge status: Home / Self Care | Payer: Medicaid Other | Source: Ambulatory Visit | Attending: Family Medicine | Admitting: Family Medicine

## 2021-06-14 VITALS — BP 128/82 | HR 88

## 2021-06-14 DIAGNOSIS — N898 Other specified noninflammatory disorders of vagina: Secondary | ICD-10-CM

## 2021-06-14 DIAGNOSIS — O24415 Gestational diabetes mellitus in pregnancy, controlled by oral hypoglycemic drugs: Secondary | ICD-10-CM

## 2021-06-14 MED ORDER — FLUCONAZOLE 150 MG PO TABS: 150.0000 mg | ORAL_TABLET | Freq: Every day | ORAL | 0 refills | Status: DC

## 2021-06-14 NOTE — Progress Notes (Signed)
Pt informed that the ultrasound is considered a limited OB ultrasound and is not intended to be a complete ultrasound exam.  Patient also informed that the ultrasound is not being completed with the intent of assessing for fetal or placental anomalies or any pelvic abnormalities.  Patient acknowledges the purpose of the exam and the limitations of the study.  Pt having UC's every 3-5 minutes apart during NST. She is not aware of UC's. Speculum exam to be performed by Dr. Ephriam Jenkins per request of Dr. Charlotta Newton - attending on call.

## 2021-06-14 NOTE — Progress Notes (Addendum)
Patient seen for NST today and having contractions on monitor (but not feeling them).   Upon discussion with Dr. Charlotta Newton (1st attending on call) patient placed on my schedule for speculum exam.   On exam cervix visualized, cerclage in place with no opening of os and no evidence of bulging bag of water. Also appreciated clumpy, thick, white discharge. Vaginitis swab collected and treated for candida based on visual exam.  [name], MD, MPH OB Fellow, Faculty Practice

## 2021-06-15 LAB — CERVICOVAGINAL ANCILLARY ONLY
Bacterial Vaginitis (gardnerella): NEGATIVE
Candida Glabrata: NEGATIVE
Candida Vaginitis: NEGATIVE
Comment: NEGATIVE
Comment: NEGATIVE
Comment: NEGATIVE

## 2021-06-20 ENCOUNTER — Other Ambulatory Visit: Payer: Self-pay

## 2021-06-20 ENCOUNTER — Ambulatory Visit (INDEPENDENT_AMBULATORY_CARE_PROVIDER_SITE_OTHER): Payer: Medicaid Other | Admitting: Obstetrics and Gynecology

## 2021-06-20 VITALS — BP 125/75 | HR 96 | Wt 178.5 lb

## 2021-06-20 DIAGNOSIS — Z3A33 33 weeks gestation of pregnancy: Secondary | ICD-10-CM

## 2021-06-20 DIAGNOSIS — O0990 Supervision of high risk pregnancy, unspecified, unspecified trimester: Secondary | ICD-10-CM

## 2021-06-20 DIAGNOSIS — O3433 Maternal care for cervical incompetence, third trimester: Secondary | ICD-10-CM

## 2021-06-20 DIAGNOSIS — Z8759 Personal history of other complications of pregnancy, childbirth and the puerperium: Secondary | ICD-10-CM

## 2021-06-20 DIAGNOSIS — O24415 Gestational diabetes mellitus in pregnancy, controlled by oral hypoglycemic drugs: Secondary | ICD-10-CM

## 2021-06-20 DIAGNOSIS — O0993 Supervision of high risk pregnancy, unspecified, third trimester: Secondary | ICD-10-CM

## 2021-06-20 DIAGNOSIS — D563 Thalassemia minor: Secondary | ICD-10-CM

## 2021-06-20 DIAGNOSIS — Z8751 Personal history of pre-term labor: Secondary | ICD-10-CM

## 2021-06-20 NOTE — Progress Notes (Signed)
   PRENATAL VISIT NOTE  Subjective:  Ruth Ellis is a 30 y.o. G6P0050 at [redacted]w[redacted]d being seen today for ongoing prenatal care.  She is currently monitored for the following issues for this high-risk pregnancy and has Generalized anxiety disorder; Attention deficit hyperactivity disorder (ADHD), predominantly inattentive type; Bereavement; History of preterm premature rupture of membranes (PPROM); Supervision of high risk pregnancy, antepartum; History of cryosurgery; Cervical insufficiency during pregnancy, antepartum, third trimester; Low lying placenta nos or without hemorrhage, second trimester; Alpha thalassemia silent carrier; Cervical cerclage suture present; H/O cervical incompetence; Gestational diabetes; [redacted] weeks gestation of pregnancy; [redacted] weeks gestation of pregnancy; [redacted] weeks gestation of pregnancy; and [redacted] weeks gestation of pregnancy on their problem list.  Patient doing well with no acute concerns today. She reports no complaints.  Contractions: Irritability. Vag. Bleeding: None.  Movement: Present. Denies leaking of fluid.   The following portions of the patient's history were reviewed and updated as appropriate: allergies, current medications, past family history, past medical history, past social history, past surgical history and problem list. Problem list updated.  Objective:   Vitals:   06/20/21 1428  BP: 125/75  Pulse: 96  Weight: 178 lb 8 oz (81 kg)    Fetal Status: Fetal Heart Rate (bpm): 144 Fundal Height: 34 cm Movement: Present     General:  Alert, oriented and cooperative. Patient is in no acute distress.  Skin: Skin is warm and dry. No rash noted.   Cardiovascular: Normal heart rate noted  Respiratory: Normal respiratory effort, no problems with respiration noted  Abdomen: Soft, gravid, appropriate for gestational age.  Pain/Pressure: Present     Pelvic: Cervical exam deferred        Extremities: Normal range of motion.  Edema: None  Mental Status:  Normal mood  and affect. Normal behavior. Normal judgment and thought content.   Assessment and Plan:  Pregnancy: G6P0050 at [redacted]w[redacted]d  1. Supervision of high risk pregnancy, antepartum Continue routine care  2. [redacted] weeks gestation of pregnancy   3. Gestational diabetes mellitus (GDM) in third trimester controlled on oral hypoglycemic drug Some improvement on increased dose of metformin, but fastings are still elevated.  Will monitor x 1 more week and if still elevated will need to transition to insulin Pt has growth BPP tomorrow  4. Cervical cerclage suture present in third trimester No complaints  5. Cervical insufficiency during pregnancy, antepartum, third trimester   6. History of preterm premature rupture of membranes (PPROM) No s/sx of preterm labor or prom  7. Alpha thalassemia silent carrier   Preterm labor symptoms and general obstetric precautions including but not limited to vaginal bleeding, contractions, leaking of fluid and fetal movement were reviewed in detail with the patient.  Please refer to After Visit Summary for other counseling recommendations.   Return in about 1 week (around 06/27/2021) for [facility], in person.   Mariel Aloe, MD Faculty Attending Center for [facility]

## 2021-06-20 NOTE — Progress Notes (Signed)
Pt reports fetal movement with some irritability. Administered 17-p in L arm and pt tolerated well.. Administrations This Visit    HYDROXYprogesterone caproate (Makena) autoinjector 275 mg    Admin Date 06/20/2021 Action Given Dose 275 mg Route Subcutaneous Administered By Katrina Stack, RN

## 2021-06-21 ENCOUNTER — Encounter: Payer: Self-pay | Admitting: *Deleted

## 2021-06-21 ENCOUNTER — Ambulatory Visit: Payer: Medicaid Other | Admitting: *Deleted

## 2021-06-21 ENCOUNTER — Ambulatory Visit: Payer: Medicaid Other | Attending: Obstetrics and Gynecology

## 2021-06-21 VITALS — BP 128/76 | HR 100

## 2021-06-21 DIAGNOSIS — Z362 Encounter for other antenatal screening follow-up: Secondary | ICD-10-CM

## 2021-06-21 DIAGNOSIS — Z148 Genetic carrier of other disease: Secondary | ICD-10-CM

## 2021-06-21 DIAGNOSIS — Z9889 Other specified postprocedural states: Secondary | ICD-10-CM | POA: Insufficient documentation

## 2021-06-21 DIAGNOSIS — O2623 Pregnancy care for patient with recurrent pregnancy loss, third trimester: Secondary | ICD-10-CM

## 2021-06-21 DIAGNOSIS — Z3A33 33 weeks gestation of pregnancy: Secondary | ICD-10-CM

## 2021-06-21 DIAGNOSIS — O0990 Supervision of high risk pregnancy, unspecified, unspecified trimester: Secondary | ICD-10-CM | POA: Insufficient documentation

## 2021-06-21 DIAGNOSIS — O3433 Maternal care for cervical incompetence, third trimester: Secondary | ICD-10-CM

## 2021-06-21 DIAGNOSIS — O24415 Gestational diabetes mellitus in pregnancy, controlled by oral hypoglycemic drugs: Secondary | ICD-10-CM | POA: Diagnosis present

## 2021-06-22 ENCOUNTER — Other Ambulatory Visit: Payer: Self-pay | Admitting: *Deleted

## 2021-06-22 DIAGNOSIS — O24415 Gestational diabetes mellitus in pregnancy, controlled by oral hypoglycemic drugs: Secondary | ICD-10-CM

## 2021-06-25 ENCOUNTER — Other Ambulatory Visit: Payer: Self-pay

## 2021-06-25 MED ORDER — METFORMIN HCL 1000 MG PO TABS: 1000.0000 mg | ORAL_TABLET | Freq: Two times a day (BID) | ORAL | 2 refills | Status: DC

## 2021-06-25 NOTE — Telephone Encounter (Signed)
Patient is requesting a refill on metformin. She reports her dose has change to 1,000 twice a day.

## 2021-06-26 ENCOUNTER — Ambulatory Visit: Payer: Medicaid Other

## 2021-06-27 ENCOUNTER — Encounter: Payer: Medicaid Other | Admitting: Obstetrics and Gynecology

## 2021-06-27 ENCOUNTER — Ambulatory Visit (INDEPENDENT_AMBULATORY_CARE_PROVIDER_SITE_OTHER): Payer: Medicaid Other | Admitting: Obstetrics and Gynecology

## 2021-06-27 ENCOUNTER — Ambulatory Visit: Payer: Medicaid Other

## 2021-06-27 ENCOUNTER — Other Ambulatory Visit: Payer: Self-pay

## 2021-06-27 VITALS — BP 130/81 | HR 87 | Wt 175.0 lb

## 2021-06-27 DIAGNOSIS — O24415 Gestational diabetes mellitus in pregnancy, controlled by oral hypoglycemic drugs: Secondary | ICD-10-CM

## 2021-06-27 DIAGNOSIS — Z3A34 34 weeks gestation of pregnancy: Secondary | ICD-10-CM

## 2021-06-27 DIAGNOSIS — D563 Thalassemia minor: Secondary | ICD-10-CM

## 2021-06-27 DIAGNOSIS — Z8751 Personal history of pre-term labor: Secondary | ICD-10-CM | POA: Diagnosis not present

## 2021-06-27 DIAGNOSIS — O3433 Maternal care for cervical incompetence, third trimester: Secondary | ICD-10-CM

## 2021-06-27 DIAGNOSIS — O0990 Supervision of high risk pregnancy, unspecified, unspecified trimester: Secondary | ICD-10-CM

## 2021-06-27 DIAGNOSIS — Z8759 Personal history of other complications of pregnancy, childbirth and the puerperium: Secondary | ICD-10-CM

## 2021-06-27 NOTE — Progress Notes (Signed)
   PRENATAL VISIT NOTE  Subjective:  Ruth Ellis is a 30 y.o. G6P0050 at [redacted]w[redacted]d being seen today for ongoing prenatal care.  She is currently monitored for the following issues for this high-risk pregnancy and has Generalized anxiety disorder; Attention deficit hyperactivity disorder (ADHD), predominantly inattentive type; Bereavement; History of preterm premature rupture of membranes (PPROM); Supervision of high risk pregnancy, antepartum; History of cryosurgery; Cervical insufficiency during pregnancy, antepartum, third trimester; Low lying placenta nos or without hemorrhage, second trimester; Alpha thalassemia silent carrier; Cervical cerclage suture present; H/O cervical incompetence; Gestational diabetes; [redacted] weeks gestation of pregnancy; [redacted] weeks gestation of pregnancy; [redacted] weeks gestation of pregnancy; [redacted] weeks gestation of pregnancy; and [redacted] weeks gestation of pregnancy on their problem list.  Patient doing well with no acute concerns today. She reports occasional contractions.  Contractions: Irregular. Vag. Bleeding: None.  Movement: Present. Denies leaking of fluid.   The following portions of the patient's history were reviewed and updated as appropriate: allergies, current medications, past family history, past medical history, past social history, past surgical history and problem list. Problem list updated.  Objective:   Vitals:   06/27/21 1425  BP: 130/81  Pulse: 87  Weight: 175 lb (79.4 kg)    Fetal Status: Fetal Heart Rate (bpm): 140 Fundal Height: 35 cm Movement: Present     General:  Alert, oriented and cooperative. Patient is in no acute distress.  Skin: Skin is warm and dry. No rash noted.   Cardiovascular: Normal heart rate noted  Respiratory: Normal respiratory effort, no problems with respiration noted  Abdomen: Soft, gravid, appropriate for gestational age.  Pain/Pressure: Present     Pelvic: Cervical exam deferred        Extremities: Normal range of motion.      Mental Status:  Normal mood and affect. Normal behavior. Normal judgment and thought content.   Assessment and Plan:  Pregnancy: G6P0050 at [redacted]w[redacted]d  1. Gestational diabetes mellitus (GDM) in third trimester controlled on oral hypoglycemic drug FBS: 74-109, 5 out of 7 out of range PPBS: 77-134, all in range  At this point would continue with metformin at current dosage since postprandial readings are all in range. Hold conversion to insulin at this time  2. Cervical insufficiency during pregnancy, antepartum, third trimester   3. Cervical cerclage suture present in third trimester Pt desires removal at next visit at 36 weeks, will be done in office.  4. History of preterm premature rupture of membranes (PPROM) No s/sx of preterm labor or PROM  5. Supervision of high risk pregnancy, antepartum Continue routine care  6. Alpha thalassemia silent carrier   7. [redacted] weeks gestation of pregnancy   Preterm labor symptoms and general obstetric precautions including but not limited to vaginal bleeding, contractions, leaking of fluid and fetal movement were reviewed in detail with the patient.  Please refer to After Visit Summary for other counseling recommendations.   Return in about 2 weeks (around 07/11/2021) for [facility], in person.   Mariel Aloe, MD Faculty Attending Center for [facility]

## 2021-06-28 ENCOUNTER — Ambulatory Visit: Payer: Medicaid Other | Attending: Obstetrics and Gynecology | Admitting: *Deleted

## 2021-06-28 ENCOUNTER — Ambulatory Visit: Payer: Medicaid Other | Admitting: *Deleted

## 2021-06-28 ENCOUNTER — Encounter: Payer: Self-pay | Admitting: *Deleted

## 2021-06-28 VITALS — BP 105/67 | HR 101

## 2021-06-28 DIAGNOSIS — O24410 Gestational diabetes mellitus in pregnancy, diet controlled: Secondary | ICD-10-CM | POA: Diagnosis not present

## 2021-06-28 DIAGNOSIS — Z3A34 34 weeks gestation of pregnancy: Secondary | ICD-10-CM | POA: Diagnosis not present

## 2021-06-28 DIAGNOSIS — O0990 Supervision of high risk pregnancy, unspecified, unspecified trimester: Secondary | ICD-10-CM

## 2021-06-28 DIAGNOSIS — O3433 Maternal care for cervical incompetence, third trimester: Secondary | ICD-10-CM

## 2021-06-28 DIAGNOSIS — Z9889 Other specified postprocedural states: Secondary | ICD-10-CM

## 2021-06-28 DIAGNOSIS — O24415 Gestational diabetes mellitus in pregnancy, controlled by oral hypoglycemic drugs: Secondary | ICD-10-CM | POA: Insufficient documentation

## 2021-06-28 NOTE — Procedures (Signed)
Ruth Ellis 01-25-1991 [redacted]w[redacted]d  Fetus A Non-Stress Test Interpretation for 06/28/21  Indication: Gestational Diabetes medication controlled  Fetal Heart Rate A Mode: External Baseline Rate (A): 150 bpm Variability: Moderate Accelerations: 15 x 15 Decelerations: None Multiple birth?: No  Uterine Activity Mode: Palpation, Toco Contraction Frequency (min): none Resting Tone Palpated: Relaxed  Interpretation (Fetal Testing) Nonstress Test Interpretation: Reactive Overall Impression: Reassuring for gestational age Comments: Dr. Grace Bushy reviewed tracing

## 2021-07-04 ENCOUNTER — Ambulatory Visit (INDEPENDENT_AMBULATORY_CARE_PROVIDER_SITE_OTHER): Payer: Medicaid Other

## 2021-07-04 ENCOUNTER — Other Ambulatory Visit: Payer: Self-pay

## 2021-07-04 DIAGNOSIS — Z8751 Personal history of pre-term labor: Secondary | ICD-10-CM

## 2021-07-04 DIAGNOSIS — O0990 Supervision of high risk pregnancy, unspecified, unspecified trimester: Secondary | ICD-10-CM

## 2021-07-04 DIAGNOSIS — Z8759 Personal history of other complications of pregnancy, childbirth and the puerperium: Secondary | ICD-10-CM

## 2021-07-04 NOTE — Progress Notes (Signed)
Nurse visit for 17p Makena given R arm without difficulties. Final Makena already scheduled for next week Refill called to [facility]

## 2021-07-05 ENCOUNTER — Ambulatory Visit (INDEPENDENT_AMBULATORY_CARE_PROVIDER_SITE_OTHER): Payer: Medicaid Other

## 2021-07-05 ENCOUNTER — Ambulatory Visit: Payer: Medicaid Other | Admitting: *Deleted

## 2021-07-05 VITALS — BP 127/85 | HR 91

## 2021-07-05 DIAGNOSIS — O24415 Gestational diabetes mellitus in pregnancy, controlled by oral hypoglycemic drugs: Secondary | ICD-10-CM

## 2021-07-05 NOTE — Progress Notes (Signed)

## 2021-07-11 ENCOUNTER — Other Ambulatory Visit: Payer: Self-pay

## 2021-07-11 ENCOUNTER — Other Ambulatory Visit (HOSPITAL_COMMUNITY)
Admission: RE | Admit: 2021-07-11 | Discharge: 2021-07-11 | Disposition: A | Discharge status: Home / Self Care | Payer: Medicaid Other | Source: Ambulatory Visit | Attending: Obstetrics and Gynecology | Admitting: Obstetrics and Gynecology

## 2021-07-11 ENCOUNTER — Ambulatory Visit (INDEPENDENT_AMBULATORY_CARE_PROVIDER_SITE_OTHER): Payer: Medicaid Other | Admitting: Obstetrics and Gynecology

## 2021-07-11 VITALS — BP 123/80 | HR 91 | Wt 176.0 lb

## 2021-07-11 DIAGNOSIS — O0990 Supervision of high risk pregnancy, unspecified, unspecified trimester: Secondary | ICD-10-CM

## 2021-07-11 DIAGNOSIS — Z8751 Personal history of pre-term labor: Secondary | ICD-10-CM

## 2021-07-11 DIAGNOSIS — Z8759 Personal history of other complications of pregnancy, childbirth and the puerperium: Secondary | ICD-10-CM

## 2021-07-11 DIAGNOSIS — O24415 Gestational diabetes mellitus in pregnancy, controlled by oral hypoglycemic drugs: Secondary | ICD-10-CM

## 2021-07-11 DIAGNOSIS — Z3A36 36 weeks gestation of pregnancy: Secondary | ICD-10-CM | POA: Insufficient documentation

## 2021-07-11 DIAGNOSIS — O3433 Maternal care for cervical incompetence, third trimester: Secondary | ICD-10-CM

## 2021-07-11 MED ORDER — HYDROXYPROGESTERONE CAPROATE 275 MG/1.1ML ~~LOC~~ SOAJ
275.0000 mg | Freq: Once | SUBCUTANEOUS | Status: AC
  Administered 2021-07-11: 275 mg via SUBCUTANEOUS

## 2021-07-11 NOTE — Progress Notes (Signed)
Last Makena injection given today.   GBS due today.  Cerclage removal today.

## 2021-07-11 NOTE — Progress Notes (Signed)
   PRENATAL VISIT NOTE  Subjective:  Ruth Ellis is a 30 y.o. G6P0050 at [redacted]w[redacted]d being seen today for ongoing prenatal care.  She is currently monitored for the following issues for this high-risk pregnancy and has Generalized anxiety disorder; Attention deficit hyperactivity disorder (ADHD), predominantly inattentive type; Bereavement; History of preterm premature rupture of membranes (PPROM); Supervision of high risk pregnancy, antepartum; History of cryosurgery; Cervical insufficiency during pregnancy, antepartum, third trimester; Low lying placenta nos or without hemorrhage, second trimester; Alpha thalassemia silent carrier; Cervical cerclage suture present; H/O cervical incompetence; Gestational diabetes; [redacted] weeks gestation of pregnancy; [redacted] weeks gestation of pregnancy; [redacted] weeks gestation of pregnancy; [redacted] weeks gestation of pregnancy; [redacted] weeks gestation of pregnancy; and [redacted] weeks gestation of pregnancy on their problem list.  Patient doing well with no acute concerns today. She reports no complaints.  Contractions: Irritability. Vag. Bleeding: None.  Movement: Present. Denies leaking of fluid.   Cerclage Removal Cerclage suture removed in sterile fashion with scissors.  Suture removed in its entirety.  No immediate cervical dilation noted.  Monsel's place on cerlcgae sites that had residual bleeding.  The following portions of the patient's history were reviewed and updated as appropriate: allergies, current medications, past family history, past medical history, past social history, past surgical history and problem list. Problem list updated.  Objective:   Vitals:   07/11/21 1505  BP: 123/80  Pulse: 91  Weight: 176 lb (79.8 kg)    Fetal Status: Fetal Heart Rate (bpm): 156 Fundal Height: 37 cm Movement: Present     General:  Alert, oriented and cooperative. Patient is in no acute distress.  Skin: Skin is warm and dry. No rash noted.   Cardiovascular: Normal heart rate noted   Respiratory: Normal respiratory effort, no problems with respiration noted  Abdomen: Soft, gravid, appropriate for gestational age.  Pain/Pressure: Present     Pelvic: Cervical exam performed Dilation: 1 Effacement (%): 50 Station: -3  Extremities: Normal range of motion.  Edema: Trace  Mental Status:  Normal mood and affect. Normal behavior. Normal judgment and thought content.   Assessment and Plan:  Pregnancy: G6P0050 at [redacted]w[redacted]d  1. History of preterm premature rupture of membranes (PPROM) Last injection given today - HYDROXYprogesterone caproate (Makena) autoinjector 275 mg  2. [redacted] weeks gestation of pregnancy   3. Gestational diabetes mellitus (GDM) in third trimester controlled on oral hypoglycemic drug FBS: 80s PPBS: 90-121  4. Cervical cerclage suture present in third trimester Cerclage removed in office, see note  5. Cervical insufficiency during pregnancy, antepartum, third trimester   6. Supervision of high risk pregnancy, antepartum Continue routine care, labor precautions given - Cervicovaginal ancillary only( [place]) - Strep Gp B NAA  Preterm labor symptoms and general obstetric precautions including but not limited to vaginal bleeding, contractions, leaking of fluid and fetal movement were reviewed in detail with the patient.  Please refer to After Visit Summary for other counseling recommendations.   Return in about 1 week (around 07/18/2021) for [facility], in person.   Mariel Aloe, MD Faculty Attending Center for [facility]

## 2021-07-11 NOTE — Progress Notes (Signed)
ROB [redacted]w[redacted]d  Pt forgot blood sugar log today was rushing out.   CC: None

## 2021-07-12 ENCOUNTER — Ambulatory Visit: Payer: Medicaid Other | Admitting: *Deleted

## 2021-07-12 ENCOUNTER — Ambulatory Visit (INDEPENDENT_AMBULATORY_CARE_PROVIDER_SITE_OTHER): Payer: Medicaid Other

## 2021-07-12 VITALS — BP 131/85 | HR 89

## 2021-07-12 DIAGNOSIS — O24415 Gestational diabetes mellitus in pregnancy, controlled by oral hypoglycemic drugs: Secondary | ICD-10-CM

## 2021-07-12 LAB — CERVICOVAGINAL ANCILLARY ONLY
Chlamydia: NEGATIVE
Comment: NEGATIVE
Comment: NEGATIVE
Comment: NORMAL
Neisseria Gonorrhea: NEGATIVE
Trichomonas: NEGATIVE

## 2021-07-12 NOTE — Progress Notes (Signed)
Pt informed that the ultrasound is considered a limited OB ultrasound and is not intended to be a complete ultrasound exam.  Patient also informed that the ultrasound is not being completed with the intent of assessing for fetal or placental anomalies or any pelvic abnormalities.  Explained that the purpose of today's ultrasound is to assess for presentation, BPP and amnioticx fluid volume.  Patient acknowledges the purpose of the exam and the limitations of the study.

## 2021-07-13 LAB — STREP GP B NAA: Strep Gp B NAA: NEGATIVE

## 2021-07-17 ENCOUNTER — Other Ambulatory Visit: Payer: Self-pay

## 2021-07-17 ENCOUNTER — Ambulatory Visit (INDEPENDENT_AMBULATORY_CARE_PROVIDER_SITE_OTHER): Payer: Medicaid Other | Admitting: Obstetrics & Gynecology

## 2021-07-17 VITALS — BP 122/84 | HR 99 | Wt 178.8 lb

## 2021-07-17 DIAGNOSIS — O3433 Maternal care for cervical incompetence, third trimester: Secondary | ICD-10-CM

## 2021-07-17 DIAGNOSIS — O24415 Gestational diabetes mellitus in pregnancy, controlled by oral hypoglycemic drugs: Secondary | ICD-10-CM

## 2021-07-17 DIAGNOSIS — O0990 Supervision of high risk pregnancy, unspecified, unspecified trimester: Secondary | ICD-10-CM

## 2021-07-17 NOTE — Progress Notes (Signed)
   PRENATAL VISIT NOTE  Subjective:  Ruth Ellis is a 30 y.o. G6P0050 at [redacted]w[redacted]d being seen today for ongoing prenatal care.  She is currently monitored for the following issues for this high-risk pregnancy and has Generalized anxiety disorder; Attention deficit hyperactivity disorder (ADHD), predominantly inattentive type; Bereavement; History of preterm premature rupture of membranes (PPROM); Supervision of high risk pregnancy, antepartum; History of cryosurgery; Cervical insufficiency during pregnancy, antepartum, third trimester; Alpha thalassemia silent carrier; Cervical cerclage suture present; H/O cervical incompetence; Gestational diabetes; [redacted] weeks gestation of pregnancy; [redacted] weeks gestation of pregnancy; [redacted] weeks gestation of pregnancy; [redacted] weeks gestation of pregnancy; and [redacted] weeks gestation of pregnancy on their problem list.  Patient reports occasional contractions.  Contractions: Irritability. Vag. Bleeding: None.  Movement: Present. Denies leaking of fluid.   The following portions of the patient's history were reviewed and updated as appropriate: allergies, current medications, past family history, past medical history, past social history, past surgical history and problem list.   Objective:   Vitals:   07/17/21 1424  BP: 122/84  Pulse: 99  Weight: 178 lb 12.8 oz (81.1 kg)    Fetal Status: Fetal Heart Rate (bpm): 148   Movement: Present  Presentation: Vertex  General:  Alert, oriented and cooperative. Patient is in no acute distress.  Skin: Skin is warm and dry. No rash noted.   Cardiovascular: Normal heart rate noted  Respiratory: Normal respiratory effort, no problems with respiration noted  Abdomen: Soft, gravid, appropriate for gestational age.  Pain/Pressure: Present     Pelvic: Cervical exam performed in the presence of a chaperone Dilation: 1 Effacement (%): 50 Station: -3  Extremities: Normal range of motion.  Edema: Trace  Mental Status: Normal mood and affect.  Normal behavior. Normal judgment and thought content.   Assessment and Plan:  Pregnancy: G6P0050 at [redacted]w[redacted]d There are no diagnoses linked to this encounter. Term labor symptoms and general obstetric precautions including but not limited to vaginal bleeding, contractions, leaking of fluid and fetal movement were reviewed in detail with the patient. Please refer to After Visit Summary for other counseling recommendations.  Gestational diabetes mellitus (GDM) in third trimester controlled on oral hypoglycemic drug  Cervical insufficiency during pregnancy, antepartum, third trimester  Supervision of high risk pregnancy, antepartum  Return in about 1 week (around 07/24/2021). BG well controlled and good fetal surveillance, BPP weekly Future Appointments  Date Time Provider Department Center  07/19/2021  2:30 PM [facility] NURSE [facility]  07/19/2021  2:45 PM [facility] US4 [facility]  07/26/2021  1:15 PM [facility] NST [facility]  08/02/2021  1:15 PM [facility] NST [facility]  08/09/2021  1:15 PM [facility] NST [facility]    Scheryl Darter, MD

## 2021-07-17 NOTE — Progress Notes (Signed)
Pt reports fetal movement with some irritability. Pt reports that fasting BG has been in the 80s

## 2021-07-19 ENCOUNTER — Other Ambulatory Visit: Payer: Self-pay

## 2021-07-19 ENCOUNTER — Ambulatory Visit (HOSPITAL_BASED_OUTPATIENT_CLINIC_OR_DEPARTMENT_OTHER): Payer: Medicaid Other

## 2021-07-19 ENCOUNTER — Ambulatory Visit (HOSPITAL_BASED_OUTPATIENT_CLINIC_OR_DEPARTMENT_OTHER): Payer: Medicaid Other | Admitting: *Deleted

## 2021-07-19 ENCOUNTER — Other Ambulatory Visit: Payer: Self-pay | Admitting: Obstetrics and Gynecology

## 2021-07-19 ENCOUNTER — Encounter: Payer: Self-pay | Admitting: *Deleted

## 2021-07-19 ENCOUNTER — Ambulatory Visit: Payer: Medicaid Other | Admitting: *Deleted

## 2021-07-19 VITALS — BP 129/73 | HR 97

## 2021-07-19 DIAGNOSIS — Z9889 Other specified postprocedural states: Secondary | ICD-10-CM

## 2021-07-19 DIAGNOSIS — O3433 Maternal care for cervical incompetence, third trimester: Secondary | ICD-10-CM

## 2021-07-19 DIAGNOSIS — O3432 Maternal care for cervical incompetence, second trimester: Secondary | ICD-10-CM | POA: Insufficient documentation

## 2021-07-19 DIAGNOSIS — Z3A37 37 weeks gestation of pregnancy: Secondary | ICD-10-CM | POA: Diagnosis not present

## 2021-07-19 DIAGNOSIS — O2623 Pregnancy care for patient with recurrent pregnancy loss, third trimester: Secondary | ICD-10-CM

## 2021-07-19 DIAGNOSIS — O0990 Supervision of high risk pregnancy, unspecified, unspecified trimester: Secondary | ICD-10-CM | POA: Insufficient documentation

## 2021-07-19 DIAGNOSIS — O24415 Gestational diabetes mellitus in pregnancy, controlled by oral hypoglycemic drugs: Secondary | ICD-10-CM | POA: Insufficient documentation

## 2021-07-19 DIAGNOSIS — O283 Abnormal ultrasonic finding on antenatal screening of mother: Secondary | ICD-10-CM

## 2021-07-19 NOTE — Procedures (Signed)
Ruth Ellis 04/17/1991 [redacted]w[redacted]d  Fetus A Non-Stress Test Interpretation for 07/19/21  Indication: Unsatisfactory BPP  Fetal Heart Rate A Mode: External Baseline Rate (A): 140 bpm Variability: Minimal, Moderate Accelerations: 15 x 15 Decelerations: None Multiple birth?: No  Uterine Activity Mode: Palpation, Toco Contraction Frequency (min): none Resting Tone Palpated: Relaxed  Interpretation (Fetal Testing) Nonstress Test Interpretation: Reactive Overall Impression: Reassuring for gestational age Comments: Dr. Grace Bushy reviewd tracing

## 2021-07-21 ENCOUNTER — Encounter (HOSPITAL_COMMUNITY): Payer: Self-pay | Admitting: Obstetrics & Gynecology

## 2021-07-21 ENCOUNTER — Other Ambulatory Visit: Payer: Self-pay

## 2021-07-21 ENCOUNTER — Inpatient Hospital Stay (HOSPITAL_COMMUNITY)
Admission: AD | Admit: 2021-07-21 | Discharge: 2021-07-23 | DRG: 768 | Disposition: A | Discharge status: Home / Self Care | Payer: Medicaid Other | Attending: Family Medicine | Admitting: Family Medicine

## 2021-07-21 ENCOUNTER — Inpatient Hospital Stay (HOSPITAL_COMMUNITY): Payer: Medicaid Other | Admitting: Anesthesiology

## 2021-07-21 DIAGNOSIS — O24419 Gestational diabetes mellitus in pregnancy, unspecified control: Secondary | ICD-10-CM | POA: Diagnosis present

## 2021-07-21 DIAGNOSIS — O24424 Gestational diabetes mellitus in childbirth, insulin controlled: Secondary | ICD-10-CM | POA: Diagnosis not present

## 2021-07-21 DIAGNOSIS — Z3A37 37 weeks gestation of pregnancy: Secondary | ICD-10-CM

## 2021-07-21 DIAGNOSIS — O322XX0 Maternal care for transverse and oblique lie, not applicable or unspecified: Secondary | ICD-10-CM | POA: Diagnosis present

## 2021-07-21 DIAGNOSIS — O4202 Full-term premature rupture of membranes, onset of labor within 24 hours of rupture: Secondary | ICD-10-CM | POA: Diagnosis not present

## 2021-07-21 DIAGNOSIS — O134 Gestational [pregnancy-induced] hypertension without significant proteinuria, complicating childbirth: Secondary | ICD-10-CM | POA: Diagnosis present

## 2021-07-21 DIAGNOSIS — Z20822 Contact with and (suspected) exposure to covid-19: Secondary | ICD-10-CM | POA: Diagnosis present

## 2021-07-21 DIAGNOSIS — Z9889 Other specified postprocedural states: Secondary | ICD-10-CM

## 2021-07-21 DIAGNOSIS — Z30014 Encounter for initial prescription of intrauterine contraceptive device: Secondary | ICD-10-CM | POA: Diagnosis not present

## 2021-07-21 DIAGNOSIS — F411 Generalized anxiety disorder: Secondary | ICD-10-CM | POA: Diagnosis present

## 2021-07-21 DIAGNOSIS — Z30430 Encounter for insertion of intrauterine contraceptive device: Secondary | ICD-10-CM | POA: Diagnosis not present

## 2021-07-21 DIAGNOSIS — O26893 Other specified pregnancy related conditions, third trimester: Secondary | ICD-10-CM | POA: Diagnosis present

## 2021-07-21 DIAGNOSIS — O0990 Supervision of high risk pregnancy, unspecified, unspecified trimester: Secondary | ICD-10-CM

## 2021-07-21 DIAGNOSIS — O411230 Chorioamnionitis, third trimester, not applicable or unspecified: Secondary | ICD-10-CM | POA: Diagnosis present

## 2021-07-21 DIAGNOSIS — O3433 Maternal care for cervical incompetence, third trimester: Secondary | ICD-10-CM | POA: Diagnosis present

## 2021-07-21 DIAGNOSIS — O24425 Gestational diabetes mellitus in childbirth, controlled by oral hypoglycemic drugs: Secondary | ICD-10-CM | POA: Diagnosis present

## 2021-07-21 DIAGNOSIS — Z7982 Long term (current) use of aspirin: Secondary | ICD-10-CM

## 2021-07-21 LAB — COMPREHENSIVE METABOLIC PANEL
ALT: 20 U/L (ref 0–44)
AST: 30 U/L (ref 15–41)
Albumin: 2.6 g/dL — ABNORMAL LOW (ref 3.5–5.0)
Alkaline Phosphatase: 259 U/L — ABNORMAL HIGH (ref 38–126)
Anion gap: 10 (ref 5–15)
BUN: 8 mg/dL (ref 6–20)
CO2: 19 mmol/L — ABNORMAL LOW (ref 22–32)
Calcium: 9.9 mg/dL (ref 8.9–10.3)
Chloride: 104 mmol/L (ref 98–111)
Creatinine, Ser: 0.83 mg/dL (ref 0.44–1.00)
GFR, Estimated: >60 mL/min (ref >60)
Glucose, Bld: 118 mg/dL — ABNORMAL HIGH (ref 70–99)
Potassium: 3.9 mmol/L (ref 3.5–5.1)
Sodium: 133 mmol/L — ABNORMAL LOW (ref 135–145)
Total Bilirubin: 0.5 mg/dL (ref 0.3–1.2)
Total Protein: 5.9 g/dL — ABNORMAL LOW (ref 6.5–8.1)

## 2021-07-21 LAB — CBC
HCT: 39.5 % (ref 36.0–46.0)
Hemoglobin: 12.7 g/dL (ref 12.0–15.0)
MCH: 28.6 pg (ref 26.0–34.0)
MCHC: 32.2 g/dL (ref 30.0–36.0)
MCV: 89 fL (ref 80.0–100.0)
Platelets: 261 10*3/uL (ref 150–400)
RBC: 4.44 MIL/uL (ref 3.87–5.11)
RDW: 13 % (ref 11.5–15.5)
WBC: 13.8 10*3/uL — ABNORMAL HIGH (ref 4.0–10.5)
nRBC: 0.1 % (ref 0.0–0.2)

## 2021-07-21 LAB — PROTEIN / CREATININE RATIO, URINE
Creatinine, Urine: 68.6 mg/dL
Protein Creatinine Ratio: 0.17 mg/mg{Cre} — ABNORMAL HIGH (ref 0.00–0.15)
Total Protein, Urine: 12 mg/dL

## 2021-07-21 LAB — TYPE AND SCREEN
ABO/RH(D): O POS
Antibody Screen: NEGATIVE

## 2021-07-21 LAB — GLUCOSE, CAPILLARY: Glucose-Capillary: 112 mg/dL — ABNORMAL HIGH (ref 70–99)

## 2021-07-21 LAB — RPR: RPR Ser Ql: NONREACTIVE

## 2021-07-21 LAB — SARS CORONAVIRUS 2 (TAT 6-24 HRS): SARS Coronavirus 2: NEGATIVE

## 2021-07-21 MED ORDER — BENZOCAINE-MENTHOL 20-0.5 % EX AERO
1.0000 "application " | INHALATION_SPRAY | CUTANEOUS | Status: DC | PRN
  Administered 2021-07-21: 1 via TOPICAL
  Filled 2021-07-21: qty 56

## 2021-07-21 MED ORDER — LEVONORGESTREL 20.1 MCG/DAY IU IUD
1.0000 | INTRAUTERINE_SYSTEM | Freq: Once | INTRAUTERINE | Status: AC
  Administered 2021-07-21: 1 via INTRAUTERINE
  Filled 2021-07-21: qty 1

## 2021-07-21 MED ORDER — SIMETHICONE 80 MG PO CHEW: 80.0000 mg | CHEWABLE_TABLET | ORAL | Status: DC | PRN

## 2021-07-21 MED ORDER — PRENATAL MULTIVITAMIN CH
1.0000 | ORAL_TABLET | Freq: Every day | ORAL | Status: DC
  Administered 2021-07-21 – 2021-07-23 (×3): 1 via ORAL
  Filled 2021-07-21 (×3): qty 1

## 2021-07-21 MED ORDER — TETANUS-DIPHTH-ACELL PERTUSSIS 5-2.5-18.5 LF-MCG/0.5 IM SUSY: 0.5000 mL | PREFILLED_SYRINGE | Freq: Once | INTRAMUSCULAR | Status: DC

## 2021-07-21 MED ORDER — OXYCODONE-ACETAMINOPHEN 5-325 MG PO TABS
2.0000 | ORAL_TABLET | ORAL | Status: DC | PRN
Start: 2021-07-21 — End: 2021-07-21

## 2021-07-21 MED ORDER — COCONUT OIL OIL
1.0000 "application " | TOPICAL_OIL | Status: DC | PRN
  Administered 2021-07-22: 1 via TOPICAL

## 2021-07-21 MED ORDER — LIDOCAINE HCL (PF) 1 % IJ SOLN
INTRAMUSCULAR | Status: DC | PRN
  Administered 2021-07-21: 8 mL via EPIDURAL

## 2021-07-21 MED ORDER — LABETALOL HCL 5 MG/ML IV SOLN: 20.0000 mg | INTRAVENOUS | Status: DC | PRN

## 2021-07-21 MED ORDER — PHENYLEPHRINE 40 MCG/ML (10ML) SYRINGE FOR IV PUSH (FOR BLOOD PRESSURE SUPPORT)
PREFILLED_SYRINGE | INTRAVENOUS | Status: AC
  Filled 2021-07-21: qty 10

## 2021-07-21 MED ORDER — FENTANYL-BUPIVACAINE-NACL 0.5-0.125-0.9 MG/250ML-% EP SOLN: 12.0000 mL/h | EPIDURAL | Status: DC | PRN

## 2021-07-21 MED ORDER — OXYCODONE-ACETAMINOPHEN 5-325 MG PO TABS: 1.0000 | ORAL_TABLET | ORAL | Status: DC | PRN

## 2021-07-21 MED ORDER — WITCH HAZEL-GLYCERIN EX PADS
1.0000 "application " | MEDICATED_PAD | CUTANEOUS | Status: DC | PRN
  Administered 2021-07-22: 1 via TOPICAL

## 2021-07-21 MED ORDER — ONDANSETRON HCL 4 MG/2ML IJ SOLN: 4.0000 mg | INTRAMUSCULAR | Status: DC | PRN

## 2021-07-21 MED ORDER — GENTAMICIN SULFATE 40 MG/ML IJ SOLN
5.0000 mg/kg | INTRAVENOUS | Status: DC
  Administered 2021-07-21: 330 mg via INTRAVENOUS
  Filled 2021-07-21: qty 8.25

## 2021-07-21 MED ORDER — FENTANYL-BUPIVACAINE-NACL 0.5-0.125-0.9 MG/250ML-% EP SOLN
EPIDURAL | Status: AC
  Filled 2021-07-21: qty 250

## 2021-07-21 MED ORDER — EPHEDRINE 5 MG/ML INJ: 10.0000 mg | INTRAVENOUS | Status: DC | PRN

## 2021-07-21 MED ORDER — MEDROXYPROGESTERONE ACETATE 150 MG/ML IM SUSP: 150.0000 mg | INTRAMUSCULAR | Status: DC | PRN

## 2021-07-21 MED ORDER — DIPHENHYDRAMINE HCL 50 MG/ML IJ SOLN: 12.5000 mg | INTRAMUSCULAR | Status: DC | PRN

## 2021-07-21 MED ORDER — LACTATED RINGERS IV SOLN
500.0000 mL | Freq: Once | INTRAVENOUS | Status: AC
  Administered 2021-07-21: 500 mL via INTRAVENOUS

## 2021-07-21 MED ORDER — OXYTOCIN-SODIUM CHLORIDE 30-0.9 UT/500ML-% IV SOLN
2.5000 [IU]/h | INTRAVENOUS | Status: DC
  Administered 2021-07-21: 2.5 [IU]/h via INTRAVENOUS
  Filled 2021-07-21: qty 500

## 2021-07-21 MED ORDER — DIPHENHYDRAMINE HCL 25 MG PO CAPS: 25.0000 mg | ORAL_CAPSULE | Freq: Four times a day (QID) | ORAL | Status: DC | PRN

## 2021-07-21 MED ORDER — IBUPROFEN 600 MG PO TABS
600.0000 mg | ORAL_TABLET | Freq: Four times a day (QID) | ORAL | Status: DC
  Administered 2021-07-21 – 2021-07-23 (×9): 600 mg via ORAL
  Filled 2021-07-21 (×9): qty 1

## 2021-07-21 MED ORDER — HYDRALAZINE HCL 20 MG/ML IJ SOLN: 10.0000 mg | INTRAMUSCULAR | Status: DC | PRN

## 2021-07-21 MED ORDER — FENTANYL-BUPIVACAINE-NACL 0.5-0.125-0.9 MG/250ML-% EP SOLN
12.0000 mL/h | EPIDURAL | Status: DC | PRN
  Administered 2021-07-21: 12 mL/h via EPIDURAL

## 2021-07-21 MED ORDER — PHENYLEPHRINE 40 MCG/ML (10ML) SYRINGE FOR IV PUSH (FOR BLOOD PRESSURE SUPPORT): 80.0000 ug | PREFILLED_SYRINGE | INTRAVENOUS | Status: DC | PRN

## 2021-07-21 MED ORDER — LIDOCAINE HCL (PF) 1 % IJ SOLN: 30.0000 mL | INTRAMUSCULAR | Status: DC | PRN

## 2021-07-21 MED ORDER — SODIUM CHLORIDE 0.9 % IV SOLN
2.0000 g | Freq: Four times a day (QID) | INTRAVENOUS | Status: DC
  Administered 2021-07-21: 2 g via INTRAVENOUS
  Filled 2021-07-21: qty 2000

## 2021-07-21 MED ORDER — ACETAMINOPHEN 325 MG PO TABS: 650.0000 mg | ORAL_TABLET | ORAL | Status: DC | PRN

## 2021-07-21 MED ORDER — DIBUCAINE (PERIANAL) 1 % EX OINT: 1.0000 "application " | TOPICAL_OINTMENT | CUTANEOUS | Status: DC | PRN

## 2021-07-21 MED ORDER — ONDANSETRON HCL 4 MG/2ML IJ SOLN
4.0000 mg | Freq: Once | INTRAMUSCULAR | Status: AC
  Administered 2021-07-21: 4 mg via INTRAVENOUS
  Filled 2021-07-21: qty 2

## 2021-07-21 MED ORDER — LACTATED RINGERS IV SOLN: INTRAVENOUS | Status: DC

## 2021-07-21 MED ORDER — FENTANYL CITRATE (PF) 100 MCG/2ML IJ SOLN: 50.0000 ug | INTRAMUSCULAR | Status: DC | PRN

## 2021-07-21 MED ORDER — SENNOSIDES-DOCUSATE SODIUM 8.6-50 MG PO TABS
2.0000 | ORAL_TABLET | Freq: Every day | ORAL | Status: DC
  Administered 2021-07-22 – 2021-07-23 (×2): 2 via ORAL
  Filled 2021-07-21 (×2): qty 2

## 2021-07-21 MED ORDER — SOD CITRATE-CITRIC ACID 500-334 MG/5ML PO SOLN: 30.0000 mL | ORAL | Status: DC | PRN

## 2021-07-21 MED ORDER — ONDANSETRON HCL 4 MG PO TABS: 4.0000 mg | ORAL_TABLET | ORAL | Status: DC | PRN

## 2021-07-21 MED ORDER — BISACODYL 10 MG RE SUPP: 10.0000 mg | Freq: Every day | RECTAL | Status: DC | PRN

## 2021-07-21 MED ORDER — MEASLES, MUMPS & RUBELLA VAC IJ SOLR: 0.5000 mL | Freq: Once | INTRAMUSCULAR | Status: DC

## 2021-07-21 MED ORDER — OXYTOCIN BOLUS FROM INFUSION
333.0000 mL | Freq: Once | INTRAVENOUS | Status: AC
  Administered 2021-07-21: 333 mL via INTRAVENOUS

## 2021-07-21 MED ORDER — LABETALOL HCL 5 MG/ML IV SOLN: 80.0000 mg | INTRAVENOUS | Status: DC | PRN

## 2021-07-21 MED ORDER — POLYETHYLENE GLYCOL 3350 17 G PO PACK
17.0000 g | PACK | Freq: Every day | ORAL | Status: DC
  Administered 2021-07-22 – 2021-07-23 (×2): 17 g via ORAL
  Filled 2021-07-21 (×3): qty 1

## 2021-07-21 MED ORDER — LACTATED RINGERS IV SOLN: 500.0000 mL | INTRAVENOUS | Status: DC | PRN

## 2021-07-21 MED ORDER — LABETALOL HCL 5 MG/ML IV SOLN: 40.0000 mg | INTRAVENOUS | Status: DC | PRN

## 2021-07-21 MED ORDER — ACETAMINOPHEN 325 MG PO TABS
650.0000 mg | ORAL_TABLET | ORAL | Status: DC | PRN
  Administered 2021-07-21 – 2021-07-23 (×5): 650 mg via ORAL
  Filled 2021-07-21 (×7): qty 2

## 2021-07-21 MED ORDER — ONDANSETRON HCL 4 MG/2ML IJ SOLN: 4.0000 mg | Freq: Four times a day (QID) | INTRAMUSCULAR | Status: DC | PRN

## 2021-07-21 NOTE — Lactation Note (Signed)
This note was copied from a baby's chart. Lactation Consultation Note  Patient Name: Ruth Ellis [id number] Date: 07/21/2021 Reason for consult: Initial assessment;Early term 37-38.6wks;Primapara;1st time breastfeeding Age:31 hours  Initial visit to 6 hours old infant of a P1 mother. Mother requests assistance with latch. Demonstrated hand expression , verbalized discomfort. Provided hand pump for colostrum expression, collected ~1-mL and spoonfed. Infant unable to latch after several attempts. Infant is sleepy and uninterested.  Discussed normal newborn behavior and patterns, signs of good milk transfer, hunger cues, tummy size and benefits of skin to skin.   Plan: 1-Breastfeeding on demand or 8-12 times in 24h period. 2-Use manual pump as needed 3-Encouraged maternal rest, hydration and food intake.   Contact LC as needed for feeds/support/concerns/questions. All questions answered at this time. Provided Lactation services brochure and promoted [employer] booklet information.    Maternal Data Has patient been taught Hand Expression?: Yes Does the patient have breastfeeding experience prior to this delivery?: No  Feeding Mother's Current Feeding Choice: Breast Milk  LATCH Score Latch: Too sleepy or reluctant, no latch achieved, no sucking elicited.  Audible Swallowing: None  Type of Nipple: Everted at rest and after stimulation  Comfort (Breast/Nipple): Soft / non-tender (areolar edema)  Hold (Positioning): Assistance needed to correctly position infant at breast and maintain latch.  LATCH Score: 5   Lactation Tools Discussed/Used Tools: Pump;Flanges Flange Size: 24 Breast pump type: Manual Pump Education: Setup, frequency, and cleaning;Milk Storage Reason for Pumping: expression of colostrum Pumping frequency: as needed Pumped volume: 1 mL  Interventions Interventions: Breast feeding basics reviewed;Assisted with latch;Skin to skin;Breast massage;Hand express;Hand  pump;Expressed milk;Education;Position options;Adjust position  Discharge Pump: Manual WIC Program: Yes  Consult Status Consult Status: Follow-up Date: 07/22/21 Follow-up type: In-patient    Ruth Ellis A Ruth Ellis Ruth Ellis 07/21/2021, 4:45 PM

## 2021-07-21 NOTE — Progress Notes (Signed)
Pharmacy Antibiotic Note  Ruth Ellis is a 30 y.o. female admitted on 07/21/2021 with  chorioamnionitits .  Pharmacy has been consulted for gentamicin dosing.  Plan: Gentamicin 5 mg/kg q24h.  Height: 5\' 5"  (165.1 cm) Weight: 79.8 kg (176 lb) IBW/kg (Calculated) : 57  Temp (24hrs), Avg:99.1 F (37.3 C), Min:98.4 F (36.9 C), Max:99.8 F (37.7 C)  Recent Labs  Lab 07/21/21 0806  WBC 13.8*    CrCl cannot be calculated (No successful lab value found.).    Allergies  Allergen Reactions   Kiwi Extract Other (See Comments)    Fruit -  Throat itches    Antimicrobials this admission: Ampicillin 2gm IV q6h 9/10 >>  Dose adjustments this admission: N/a  Microbiology results:  Thank you for allowing pharmacy to be a part of this patient's care.  09/20/21 Ruth Ellis 07/21/2021 9:10 AM

## 2021-07-21 NOTE — Progress Notes (Signed)
Md at BS

## 2021-07-21 NOTE — Lactation Note (Signed)
This note was copied from a baby's chart. Lactation Consultation Note  Patient Name: Ruth Ellis [id number] Date: 07/21/2021 Reason for consult: L&D Initial assessment;Primapara;1st time breastfeeding;Early term 37-38.6wks Age:31 hours   Initial L&D Consult:  Visited with family > 1 hour after birth due to extended repair Mother had baby latched when I arrived; assisted to support and align better for latching.  Observed baby feeding on/off for 10 minutes before placing him STS where he remained calm.  Reassured mother that lactation services will be provided on the M/B unit.  Allowed time for family bonding.  No support person present at this time.     Maternal Data Has patient been taught Hand Expression?: No Does the patient have breastfeeding experience prior to this delivery?: No  Feeding Mother's Current Feeding Choice: Breast Milk  LATCH Score Latch: Repeated attempts needed to sustain latch, nipple held in mouth throughout feeding, stimulation needed to elicit sucking reflex.  Audible Swallowing: None  Type of Nipple: Everted at rest and after stimulation  Comfort (Breast/Nipple): Soft / non-tender  Hold (Positioning): Assistance needed to correctly position infant at breast and maintain latch.  LATCH Score: 6   Lactation Tools Discussed/Used    Interventions Interventions: Assisted with latch;Skin to skin  Discharge    Consult Status Consult Status: Follow-up from L&D    Ruth Ellis R Odai Wimmer 07/21/2021, 11:58 AM

## 2021-07-21 NOTE — OB Triage Provider Note (Signed)
None      S: Ms. Ruth Ellis is a 30 y.o. G6P0050 at [redacted]w[redacted]d  who presents to [facility] today complaining of leaking of fluid since 0400. She denies vaginal bleeding. She endorses contractions. She reports normal fetal movement.    O: BP (!) 161/99 (BP Location: Right Arm)   Pulse (!) 108   Temp 98.4 F (36.9 C) (Oral)   Resp (!) 22   LMP 10/31/2020 (Exact Date)  GENERAL: Well-developed, well-nourished female in no acute distress.  HEAD: Normocephalic, atraumatic.  CHEST: Normal effort of breathing, regular heart rate ABDOMEN: Soft, nontender, gravid PELVIC: Normal external female genitalia. Vagina is pink and rugated. Cervix with normal contour, no lesions. Normal discharge.  Positive pooling. Fetal Hair noted from os which appears to be about 3-4cm. Fern Deferred  Cervical exam:  Dilation: 1.5 Effacement (%): 70 Station: -3 Presentation: Vertex Exam by:: Santiago Bur, RN   Fetal Monitoring: FHT: 150 bpm, Mod Var, -Decels, -Accels Toco: Qmin  No results found for this or any previous visit (from the past 24 hour(s)).   A: SIUP at [redacted]w[redacted]d  SROM GBS Negative Cat I FT  P: Nurse instructed to contact labor team for admission orders.   Gerrit Heck, CNM 07/21/2021 6:01 AM

## 2021-07-21 NOTE — Anesthesia Procedure Notes (Signed)
Epidural Patient location during procedure: OB Start time: 07/21/2021 7:41 AM End time: 07/21/2021 7:47 AM  Staffing Anesthesiologist: Bethena Midget, MD  Preanesthetic Checklist Completed: patient identified, IV checked, site marked, risks and benefits discussed, surgical consent, monitors and equipment checked, pre-op evaluation and timeout performed  Epidural Patient position: sitting Prep: DuraPrep and site prepped and draped Patient monitoring: continuous pulse ox and blood pressure Approach: midline Location: L4-L5 Injection technique: LOR air  Needle:  Needle type: Tuohy  Needle gauge: 17 G Needle length: 9 cm and 9 Needle insertion depth: 6 cm Catheter type: closed end flexible Catheter size: 19 Gauge Catheter at skin depth: 12 cm Test dose: negative  Assessment Events: blood not aspirated, injection not painful, no injection resistance, no paresthesia and negative IV test

## 2021-07-21 NOTE — Anesthesia Preprocedure Evaluation (Signed)
Anesthesia Evaluation  Patient identified by MRN, date of birth, ID band Patient awake    Reviewed: Allergy & Precautions, H&P , NPO status , Patient's Chart, lab work & pertinent test results, reviewed documented beta blocker date and time   Airway Mallampati: II  TM Distance: >3 FB Neck ROM: full    Dental no notable dental hx.    Pulmonary neg pulmonary ROS,    Pulmonary exam normal breath sounds clear to auscultation       Cardiovascular negative cardio ROS Normal cardiovascular exam Rhythm:regular Rate:Normal     Neuro/Psych PSYCHIATRIC DISORDERS Anxiety Depression negative neurological ROS     GI/Hepatic negative GI ROS, Neg liver ROS,   Endo/Other  diabetes, Gestational  Renal/GU negative Renal ROS  negative genitourinary   Musculoskeletal   Abdominal   Peds  Hematology  (+) Blood dyscrasia, Sickle cell trait ,   Anesthesia Other Findings   Reproductive/Obstetrics (+) Pregnancy                             Anesthesia Physical Anesthesia Plan  ASA: 3  Anesthesia Plan: Epidural   Post-op Pain Management:    Induction:   PONV Risk Score and Plan:   Airway Management Planned: Natural Airway  Additional Equipment: None  Intra-op Plan:   Post-operative Plan:   Informed Consent: I have reviewed the patients History and Physical, chart, labs and discussed the procedure including the risks, benefits and alternatives for the proposed anesthesia with the patient or authorized representative who has indicated his/her understanding and acceptance.     Dental Advisory Given  Plan Discussed with: Anesthesiologist  Anesthesia Plan Comments: (Labs checked- platelets confirmed with RN in room. Fetal heart tracing, per RN, reported to be stable enough for sitting procedure. Discussed epidural, and patient consents to the procedure:  included risk of possible headache,backache,  failed block, allergic reaction, and nerve injury. This patient was asked if she had any questions or concerns before the procedure started.)        Anesthesia Quick Evaluation

## 2021-07-21 NOTE — Anesthesia Postprocedure Evaluation (Signed)
Anesthesia Post Note  Patient: Ruth Ellis  Procedure(s) Performed: AN AD HOC LABOR EPIDURAL     Patient location during evaluation: Mother Baby Anesthesia Type: Epidural Level of consciousness: awake and alert Pain management: pain level controlled Vital Signs Assessment: post-procedure vital signs reviewed and stable Respiratory status: spontaneous breathing, nonlabored ventilation and respiratory function stable Cardiovascular status: stable Postop Assessment: no headache, no backache and epidural receding Anesthetic complications: no   No notable events documented.  Last Vitals:  Vitals:   07/21/21 1146 07/21/21 1200  BP: (!) 147/84 (!) 137/96  Pulse: (!) 104 (!) 110  Resp:    Temp:    SpO2:      Last Pain:  Vitals:   07/21/21 1130  TempSrc: Oral  PainSc:    Pain Goal:                   Violia Knopf

## 2021-07-21 NOTE — Discharge Summary (Addendum)
  Postpartum Discharge Summary      Patient Name: Ruth Ellis DOB: 10/11/1991 MRN: [id number]  Date of admission: 07/21/2021 Delivery date:07/21/2021  Delivering provider: DAS, ANUKA  Date of discharge: 07/23/2021  Admitting diagnosis: Indication for care in labor or delivery [O75.9] Intrauterine pregnancy: [redacted]w[redacted]d     Secondary diagnosis:  Active Problems:   Generalized anxiety disorder   Cervical insufficiency during pregnancy, antepartum, third trimester   Gestational diabetes   Indication for care in labor or delivery   Vaginal delivery Gestational hypertension  Additional problems: None    Discharge diagnosis: Term Pregnancy Delivered                                              Post partum procedures: None Augmentation:  None Complications: None  Hospital course: Onset of Labor With Vaginal Delivery      30 y.o. yo [id number] at [redacted]w[redacted]d was admitted in Active Labor after SROM on 07/21/2021. Patient had an uncomplicated labor course as follows:  Membrane Rupture Time/Date:  ,  07/21/2021 at 0400 Delivery Method:Vaginal, Spontaneous  Episiotomy: None  Lacerations:  3rd degree   Patient passed flatus and was started on bowel regimen for 3A tear. Also diagnosed w gHTN on admission, discharged on Nifedipine. Otherwise had an uncomplicated postpartum course.  She is ambulating, tolerating a regular diet, passing flatus, and urinating well. Patient is discharged home in stable condition on 07/23/21.  Newborn Data: Birth date:07/21/2021  Birth time:10:20 AM  Gender:Female  Living status:Living  Apgars:8 ,9  Weight:7 lb 6.9 oz (3.37 kg)   Magnesium Sulfate received: No BMZ received: No Rhophylac:N/A MMR:No T-DaP:Given prenatally Flu: No Transfusion:No  Physical exam  Vitals:   07/22/21 0529 07/22/21 1358 07/22/21 2015 07/23/21 0550  BP: 119/77 116/86 131/78 121/90  Pulse: 67 97 86 82  Resp: 18 20 18 16  Temp: 98.7 F (37.1 C) (!) 97.1 F (36.2 C) 98 F (36.7 C) 97.8 F  (36.6 C)  TempSrc: Oral Oral Oral Oral  SpO2:   99% 100%  Weight:      Height:       General: alert, cooperative, and no distress Lochia: appropriate Uterine Fundus: firm DVT Evaluation: No evidence of DVT seen on physical exam. Labs: Lab Results  Component Value Date   WBC 13.8 (H) 07/21/2021   HGB 12.7 07/21/2021   HCT 39.5 07/21/2021   MCV 89.0 07/21/2021   PLT 261 07/21/2021   CMP Latest Ref Rng & Units 07/21/2021  Glucose 70 - 99 mg/dL 118(H)  BUN 6 - 20 mg/dL 8  Creatinine 0.44 - 1.00 mg/dL 0.83  Sodium 135 - 145 mmol/L 133(L)  Potassium 3.5 - 5.1 mmol/L 3.9  Chloride 98 - 111 mmol/L 104  CO2 22 - 32 mmol/L 19(L)  Calcium 8.9 - 10.3 mg/dL 9.9  Total Protein 6.5 - 8.1 g/dL 5.9(L)  Total Bilirubin 0.3 - 1.2 mg/dL 0.5  Alkaline Phos 38 - 126 U/L 259(H)  AST 15 - 41 U/L 30  ALT 0 - 44 U/L 20   Edinburgh Score: Edinburgh Postnatal Depression Scale Screening Tool 07/22/2021  I have been able to laugh and see the funny side of things. 0  I have looked forward with enjoyment to things. 0  I have blamed myself unnecessarily when things went wrong. 0  I have been anxious or worried for no good reason.   2  I have felt scared or panicky for no good reason. 1  Things have been getting on top of me. 1  I have been so unhappy that I have had difficulty sleeping. 0  I have felt sad or miserable. 0  I have been so unhappy that I have been crying. 0  The thought of harming myself has occurred to me. 0  Edinburgh Postnatal Depression Scale Total 4     After visit meds:  Allergies as of 07/23/2021       Reactions   Kiwi Extract Other (See Comments)   Fruit -  Throat itches        Medication List     STOP taking these medications    Accu-Chek Guide test strip Generic drug: glucose blood   Accu-Chek Guide w/Device Kit   Accu-Chek Softclix Lancets lancets   aspirin EC 81 MG tablet   cholecalciferol 25 MCG (1000 UNIT) tablet Commonly known as: VITAMIN D3   DHA  200 MG Caps   fluconazole 150 MG tablet Commonly known as: Diflucan   metFORMIN 1000 MG tablet Commonly known as: GLUCOPHAGE   vitamin C 500 MG tablet Commonly known as: ASCORBIC ACID       TAKE these medications    acetaminophen 325 MG tablet Commonly known as: Tylenol Take 2 tablets (650 mg total) by mouth every 4 (four) hours as needed (for pain scale < 4).   ferrous sulfate 325 (65 FE) MG tablet Take 325 mg by mouth daily with breakfast.   ibuprofen 200 MG tablet Commonly known as: ADVIL Take 3 tablets (600 mg total) by mouth every 6 (six) hours.   NIFEdipine 30 MG 24 hr tablet Commonly known as: PROCARDIA-XL/NIFEDICAL-XL Take 1 tablet (30 mg total) by mouth daily.   polyethylene glycol 17 g packet Commonly known as: MiraLax Take 17 g by mouth daily.   Prenatal Vitamins 28-0.8 MG Tabs Take 1 tablet by mouth daily.         Discharge home in stable condition Infant Feeding: Breast Infant Disposition:home with mother Discharge instruction: per After Visit Summary and Postpartum booklet. Activity: Advance as tolerated. Pelvic rest for 6 weeks.  Diet: routine diet Future Appointments: Future Appointments  Date Time Provider Department Center  07/30/2021 11:00 AM [facility] NURSE [facility] None  08/20/2021  9:15 AM Ervin, Michael L, MD [facility] None    Follow up Visit: Message sent to [facility] on 9/10 by Dr. Das  Please schedule this patient for a In person postpartum visit in 4 weeks with the following provider: Any provider. Additional Postpartum F/U:BP check 1 week and incision check (3rd degree repair) Low risk pregnancy complicated by:  None Delivery mode:  Vaginal, Spontaneous  Anticipated Birth Control:   PP liletta placed   07/23/2021 Anuka Das, MD    Attestation of Attending Supervision of Obstetric Fellow: Evaluation and management procedures were performed by the Obstetric Fellow under my supervision and collaboration.  I have reviewed the  Obstetric Fellow's note and chart, and I agree with the management and plan. I have also made any necessary editorial changes.   Matthew M Eckstat, MD Family Medicine Attending, [facility], [facility] 07/25/2021 10:50 AM     

## 2021-07-21 NOTE — H&P (Signed)
Ruth Ellis is an 30 y.o. [id number].   Chief Complaint: SROM HPI: Patient here with SROM. Has h/o incompetent cervix and cerclage removed last week. She is uncomfortable. Admitted and brought up at 7 cm. Now complete and s/p epidural. She is comfortable. Found to have low grade temp and maternal and fetal tachycardia.  PNC complicated by GDM A2 on Metformin. EFW 83%. And h/o incompetent cervix, s/p cerclage this pregnancy.  Past Medical History:  Diagnosis Date   ADHD    Anxiety    Depression    Vaginal Pap smear, abnormal     Past Surgical History:  Procedure Laterality Date   CERVICAL CERCLAGE N/A 03/07/2021   Procedure: CERCLAGE CERVICAL;  Surgeon: PAletha Halim MD;  Location: [facility];  Service: Gynecology;  Laterality: N/A;  EDC 08-07-21. Will be 18+1 on DOS.   CRYOTHERAPY     DILATION AND CURETTAGE OF UTERUS      Family History  Problem Relation Age of Onset   Diabetes Mother    Heart disease Mother    Renal Disease Mother    Diabetes Father    Social History:  reports that she has never smoked. She has never used smokeless tobacco. She reports that she does not currently use alcohol. She reports that she does not use drugs.    Allergies  Allergen Reactions   Kiwi Extract Other (See Comments)    Fruit -  Throat itches    Medications Prior to Admission  Medication Sig Dispense Refill   aspirin EC 81 MG tablet Take 81 mg by mouth daily. Swallow whole.     cholecalciferol (VITAMIN D3) 25 MCG (1000 UNIT) tablet Take 1,000 Units by mouth daily.     Docosahexaenoic Acid (DHA) 200 MG CAPS Take 200 mg by mouth daily.     ferrous sulfate 325 (65 FE) MG tablet Take 325 mg by mouth daily with breakfast.     metFORMIN (GLUCOPHAGE) 1000 MG tablet Take 1 tablet (1,000 mg total) by mouth 2 (two) times daily with a meal. 30 tablet 2   Prenatal Vit-Fe Fumarate-FA (PRENATAL VITAMINS) 28-0.8 MG TABS Take 1 tablet by mouth daily.     vitamin C (ASCORBIC ACID) 500 MG  tablet Take 500 mg by mouth daily.     Accu-Chek Softclix Lancets lancets Use to check blood sugars four times a day by percutaneous route 100 each 12   Blood Glucose Monitoring Suppl (ACCU-CHEK GUIDE) w/Device KIT Use to check blood sugars four times a day by percutaneous route 1 kit 0   fluconazole (DIFLUCAN) 150 MG tablet Take 1 tablet (150 mg total) by mouth daily. (Patient not taking: No sig reported) 1 tablet 0   glucose blood (ACCU-CHEK GUIDE) test strip Use to check blood sugars four times a day by percutaneous route 100 each 12     A comprehensive review of systems was negative.  Blood pressure (!) 148/90, pulse (!) 113, temperature 99.8 F (37.7 C), temperature source Oral, resp. rate 18, height '5\' 5"'  (1.651 m), weight 79.8 kg, last menstrual period 10/31/2020, SpO2 100 %. General appearance: alert, cooperative, and appears stated age Head: Normocephalic, without obvious abnormality, atraumatic Neck: supple, symmetrical, trachea midline Lungs:  normal effort Heart: regular rate and rhythm Abdomen:  gravid, non-tender Extremities: Homans sign is negative, no sign of DVT Skin: Skin color, texture, turgor normal. No rashes or lesions Neurologic: Grossly normal Dilation: 10 Dilation Complete Date: 07/21/21 Dilation Complete Time: 0846 Effacement (%): 100 Cervical Position:  [place]: 0 Presentation: Vertex Exam by:: Polos RN NST:  Baseline: 170 bpm, Variability: Good {> 6 bpm), Accelerations: Reactive, and Decelerations: Absent    Lab Results  Component Value Date   WBC 13.8 (H) 07/21/2021   HGB 12.7 07/21/2021   HCT 39.5 07/21/2021   MCV 89.0 07/21/2021   PLT 261 07/21/2021           ABO, Rh: --/--/PENDING (09/10 3244)  Antibody: PENDING (09/10 0806)  Rubella: 5.55 (04/07 1639)  RPR: Non Reactive (06/21 1120)  HBsAg: Negative (04/07 1639)  HIV: Non Reactive (06/21 1120)  GBS: Negative/-- (08/31 0413)     Prenatal Transfer Tool  Maternal Diabetes: Yes:   Diabetes Type:  Insulin/Medication controlled Genetic Screening: Normal Maternal Ultrasounds/Referrals: Normal Fetal Ultrasounds or other Referrals:  None Maternal Substance Abuse:  No Significant Maternal Medications:  Meds include: Other:  ASA and Metformin Significant Maternal Lab Results: Group B Strep negative   Assessment/Plan Active Problems:   Generalized anxiety disorder   Cervical insufficiency during pregnancy, antepartum, third trimester   Gestational diabetes   Indication for care in labor or delivery   #Labor: complete, anticipate SVD #Pain: Epidural in #FWB: Category 2 and reassuring #ID: maternal and fetal tachy, low grade temp 99--will start Amp/Gent for presumed Triple I #MOF: Breast #MOC: IUD pp #Circ:  yes--consented #Anemia: none  Donnamae Jude 07/21/2021, 9:02 AM

## 2021-07-21 NOTE — MAU Note (Signed)
Pt reports to MAU c/o water breaking at 0400 with ctx that started at 0415. Pain score: 7/10

## 2021-07-21 NOTE — Progress Notes (Signed)
First push

## 2021-07-22 LAB — GLUCOSE, CAPILLARY: Glucose-Capillary: 88 mg/dL (ref 70–99)

## 2021-07-22 NOTE — Plan of Care (Signed)
  Problem: Education: Goal: Knowledge of condition will improve Outcome: Completed/Met   Problem: Activity: Goal: Will verbalize the importance of balancing activity with adequate rest periods Outcome: Completed/Met   Problem: Life Cycle: Goal: Chance of risk for complications during the postpartum period will decrease Outcome: Completed/Met   

## 2021-07-22 NOTE — Progress Notes (Signed)
Post Partum Day 1 Subjective: Patient is doing well. Her pain is controlled and her bleeding is lightening up. She is eating, drinking, and voiding without issue. She has passed flatus. She is breast feeding and working on latching. She has no other concerns this AM.   Objective: Blood pressure 119/77, pulse 67, temperature 98.7 F (37.1 C), temperature source Oral, resp. rate 18, height '5\' 5"'  (1.651 m), weight 79.8 kg, last menstrual period 10/31/2020, SpO2 100 %, unknown if currently breastfeeding.  Physical Exam:  General: alert, cooperative, and no distress Lochia: appropriate Uterine Fundus: firm DVT Evaluation: no LE edema or calf tenderness to palpation  Recent Labs    07/21/21 0806  HGB 12.7  HCT 39.5    Assessment/Plan: Ruth Ellis is a 30 year old [id number] on PPD#1 s/p SVD.  Patient is progressing well and meeting postpartum milestones. VSS.  Gestational HTN: - Met criteria for gestational HTN upon admission  - UPC 0.17 - Remains asymptomatic; BP within normal limits since delivery - Will continue to monitor  - Will need 1 week BP check in office   A2GDM: - Fasting CBG this AM 88 - Plan for 2 hour GTT at postpartum visit  Feeding: Breast Contraception: ppIUD placed  Circ: Desires; consented at bedside today   Dispo: Plan for discharge PPD#2   LOS: 1 day   Genia Del, MD 07/22/2021, 9:05 AM

## 2021-07-23 MED ORDER — POLYETHYLENE GLYCOL 3350 17 G PO PACK: 17.0000 g | PACK | Freq: Every day | ORAL | 0 refills | Status: AC

## 2021-07-23 MED ORDER — NIFEDIPINE ER OSMOTIC RELEASE 30 MG PO TB24: 30.0000 mg | ORAL_TABLET | Freq: Every day | ORAL | 11 refills | Status: DC

## 2021-07-23 MED ORDER — NIFEDIPINE ER OSMOTIC RELEASE 30 MG PO TB24: 30.0000 mg | ORAL_TABLET | Freq: Every day | ORAL | 0 refills | Status: DC

## 2021-07-23 MED ORDER — IBUPROFEN 200 MG PO TABS: 600.0000 mg | ORAL_TABLET | Freq: Four times a day (QID) | ORAL | Status: AC

## 2021-07-23 MED ORDER — ACETAMINOPHEN 325 MG PO TABS: 650.0000 mg | ORAL_TABLET | ORAL | Status: AC | PRN

## 2021-07-23 NOTE — Social Work (Signed)
CSW received consult for hx of Anxiety.  CSW met with MOB to offer support and complete assessment.     CSW introduced self and role. CSW observed FOB Malcom sleep on couch and infant absent for procedure. MOB was engaged and receptive to visit. MOB declined to speak in private, stating FOB could remain in room. CSW informed MOB of reason for consult and assessed current mood. MOB reported she is feeling well. MOB shared she has some general worries about being a new mother, however the feelings aren't unmanageable. MOB stated "I'm in a good place." MOB reported that she had no mental health concerns during pregnancy. CSW discussed MOB mental health history. MOB disclosed she was diagnosed with ADHD in 2017 and anxiety in 2019. MOB stated she was on Adderall 20mg and Zoloft in 2020. MOB reported she stopped the medications in August of 2021, but found them to be helpful. MOB shared that she has also attended therapy periodically from 2017 through 2021, which she also found to be helpful. MOB expressed the desire to restart therapy postpartum. MOB identified a large support system consisting of FOB, the [employer] program, a doula, family and friends. MOB denies any current SI or Hi.   CSW provided education regarding the baby blues period versus PPD and provided resources. CSW provided MOB with additional resources within her [facility] network.  CSW provided the New Mom Checklist and encouraged MOB to self evaluate and contact a medical professional if symptoms are noted at any time.   CSW provided review of Sudden Infant Death Syndrome (SIDS) precautions.  MOB has a bassinet and car seat. MOB identified [facility] for follow-up care and denies any barriers. MOB is enrolled in WIC and plans to have infant added to benefits. MOB expressed no additional needs at this time.   CSW identifies no further need for intervention and no barriers to discharge at this time.  Mattingly Fountaine, LCSWA Clinical Social  [facility] [phone]  

## 2021-07-23 NOTE — Lactation Note (Signed)
This note was copied from a baby's chart. Lactation Consultation Note  Patient Name: Ruth Ellis [id number] Date: 07/23/2021 Reason for consult: Follow-up assessment;Early term 37-38.6wks Age:31 hours  P1, Baby latched in football hold upon entering. Assisted with relatching in cross cradle on R breast.  Mother doubting her supply.  Reassured mother by mother easily hand expressing good flow.   Baby is sleepy at the breast. Encouraged post pumping 4-6 times per day and giving volume back to baby until he is more active at the breast. Suggest offering breast each feeding before supplementing with formula. Reviewed engorgement care and monitoring voids/stools.  Maternal Data Has patient been taught Hand Expression?: Yes Does the patient have breastfeeding experience prior to this delivery?: No  Feeding Mother's Current Feeding Choice: Breast Milk and Formula  LATCH Score Latch: Grasps breast easily, tongue down, lips flanged, rhythmical sucking.  Audible Swallowing: A few with stimulation  Type of Nipple: Everted at rest and after stimulation  Comfort (Breast/Nipple): Soft / non-tender  Hold (Positioning): Assistance needed to correctly position infant at breast and maintain latch.  LATCH Score: 8   Lactation Tools Discussed/Used    Interventions Interventions: Breast feeding basics reviewed;Assisted with latch;Skin to skin;Hand express;Adjust position;Position options;Hand pump;DEBP;Education  Discharge Discharge Education: Engorgement and breast care;Warning signs for feeding baby Pump: [facility] Loan[facility] (upon discharge per mother) [facility] Program: Yes  Consult Status Consult Status: Complete Date: 07/23/21    Dahlia Byes [facility] 07/23/2021, 9:49 AM

## 2021-07-24 ENCOUNTER — Other Ambulatory Visit: Payer: Self-pay

## 2021-07-24 ENCOUNTER — Encounter: Payer: Medicaid Other | Admitting: Obstetrics & Gynecology

## 2021-07-25 ENCOUNTER — Encounter: Payer: Self-pay | Admitting: Family Medicine

## 2021-07-25 DIAGNOSIS — O139 Gestational [pregnancy-induced] hypertension without significant proteinuria, unspecified trimester: Secondary | ICD-10-CM | POA: Insufficient documentation

## 2021-07-25 DIAGNOSIS — Z975 Presence of (intrauterine) contraceptive device: Secondary | ICD-10-CM | POA: Insufficient documentation

## 2021-07-26 ENCOUNTER — Other Ambulatory Visit: Payer: Self-pay

## 2021-07-30 ENCOUNTER — Other Ambulatory Visit: Payer: Self-pay

## 2021-07-30 ENCOUNTER — Ambulatory Visit (INDEPENDENT_AMBULATORY_CARE_PROVIDER_SITE_OTHER): Payer: 59 | Admitting: Obstetrics

## 2021-07-30 ENCOUNTER — Encounter: Payer: Self-pay | Admitting: Obstetrics

## 2021-07-30 DIAGNOSIS — Z8632 Personal history of gestational diabetes: Secondary | ICD-10-CM

## 2021-07-30 DIAGNOSIS — O135 Gestational [pregnancy-induced] hypertension without significant proteinuria, complicating the puerperium: Secondary | ICD-10-CM

## 2021-07-30 NOTE — Progress Notes (Signed)
Subjective:  Ruth Ellis is a 30 y.o. female here for BP check and 3rd degree laceration check s/p SVD on 07/21/21.  Hypertension ROS: Patient denies any headaches, visual symptoms, RUQ/epigastric pain or other concerning symptoms.  Objective:  BP 137/87 P85 LMP 10/31/2020 (Exact Date)   Appearance alert, well appearing, and in no distress. General exam BP noted to be stable today in office.    Assessment:   Blood Pressure well controlled.   Plan:  Current treatment plan is effective, no change in therapy.  Patient was assessed and managed by nursing staff during this encounter. I have reviewed the chart and agree with the documentation and plan. I have also made any necessary editorial changes.  Coral Ceo, MD 07/30/2021 12:06 PM     Patient ID: Ruth Ellis, female   DOB: [birth year], 30 y.o.   MRN: [id number]  Chief Complaint  Patient presents with   Blood Pressure Check   Wound Check    HPI Ruth Ellis is a 30 y.o. female.  History of PIH and 3rd degree laceration at delivery.  Presents at 9 days postpartum for BP check, and check of closure of 3rd degree laceration.  No complaints. HPI  Past Medical History:  Diagnosis Date   ADHD    Anxiety    Depression    Vaginal Pap smear, abnormal     Past Surgical History:  Procedure Laterality Date   CERVICAL CERCLAGE N/A 03/07/2021   Procedure: CERCLAGE CERVICAL;  Surgeon: Tillar Bing, MD;  Location: [facility] LD [facility]S;  Service: Gynecology;  Laterality: N/A;  EDC 08-07-21. Will be 18+1 on DOS.   CRYOTHERAPY     DILATION AND CURETTAGE OF UTERUS      Family History  Problem Relation Age of Onset   Diabetes Mother    Heart disease Mother    Renal Disease Mother    Diabetes Father     Social History Social History   Tobacco Use   Smoking status: Never   Smokeless tobacco: Never  Vaping Use   Vaping Use: Never used  Substance Use Topics   Alcohol use: Not Currently    Comment: Social   Drug use: Never     Allergies  Allergen Reactions   Kiwi Extract Other (See Comments)    Fruit -  Throat itches    Current Outpatient Medications  Medication Sig Dispense Refill   acetaminophen (TYLENOL) 325 MG tablet Take 2 tablets (650 mg total) by mouth every 4 (four) hours as needed (for pain scale < 4).     ferrous sulfate 325 (65 FE) MG tablet Take 325 mg by mouth daily with breakfast.     ibuprofen (ADVIL) 200 MG tablet Take 3 tablets (600 mg total) by mouth every 6 (six) hours.     NIFEdipine (PROCARDIA-XL/NIFEDICAL-XL) 30 MG 24 hr tablet Take 1 tablet (30 mg total) by mouth daily. 30 tablet 11   polyethylene glycol (MIRALAX) 17 g packet Take 17 g by mouth daily. 14 each 0   Prenatal Vit-Fe Fumarate-FA (PRENATAL VITAMINS) 28-0.8 MG TABS Take 1 tablet by mouth daily.     No current facility-administered medications for this visit.    Review of Systems Review of Systems Constitutional: negative for fatigue and weight loss Respiratory: negative for cough and wheezing Cardiovascular: negative for chest pain, fatigue and palpitations Gastrointestinal: negative for abdominal pain and change in bowel habits Genitourinary:negative Integument/breast: negative for nipple discharge Musculoskeletal:negative for myalgias Neurological: negative for gait problems and tremors Behavioral/Psych: negative for  abusive relationship, depression Endocrine: negative for temperature intolerance      Blood pressure 137/87, pulse 85, last menstrual period 10/31/2020, unknown if currently breastfeeding.  Physical Exam Physical Exam General:   Alert and no distress  Skin:   no rash or abnormalities  Lungs:   clear to auscultation bilaterally  Heart:   regular rate and rhythm, S1, S2 normal, no murmur, click, rub or gallop  Breasts:   normal without suspicious masses, skin or nipple changes or axillary nodes  Abdomen:  normal findings: no organomegaly, soft, non-tender and no hernia                      Vagina   3rd degree perineal laceration closure is well approximated, NT     I have spent a total of 15 minutes of face-to-face time, excluding clinical staff time, reviewing notes and preparing to see patient, ordering tests and/or medications, and counseling the patient.   Data Reviewed BP  Assessment     1. Postpartum care following vaginal delivery - doing well at 9 days postpartum  2. Hypertension in pregnancy, transient, postpartum condition - clinically stable, on Nifedipine XL, 30 mg po daily  3. History of gestational diabetes mellitus (GDM) - needs 2 hour GTT at 6-8 weeks postpartum     Plan   Follow up in 3 weeks   Brock Bad, MD 07/30/2021 12:15 PM

## 2021-08-02 ENCOUNTER — Other Ambulatory Visit: Payer: Self-pay

## 2021-08-04 ENCOUNTER — Telehealth (HOSPITAL_COMMUNITY): Payer: Self-pay | Admitting: *Deleted

## 2021-08-04 ENCOUNTER — Other Ambulatory Visit: Payer: Self-pay | Admitting: Obstetrics

## 2021-08-04 NOTE — Telephone Encounter (Signed)
Hospital Discharge Follow-Up Call:  Doing well overall.  Saw doctor to check stitches and they are healing well.  Taking BP medication and will see doctor again at 6 weeks postpartum.  EPDS today was 8 and patient endorses that she is doing well emotionally.  She says that she feels overwhelmed sometimes as a new parent, but that she has good support and is actively doing things for her mental health like getting outside at least once a day.  Patient reports that baby is doing well and there are no concerns about baby's health at this time.  She says that baby sleeps in a bassinet and she is familiar with the ABCs of Safe Sleep.

## 2021-08-09 ENCOUNTER — Other Ambulatory Visit: Payer: Self-pay

## 2021-08-20 ENCOUNTER — Other Ambulatory Visit (HOSPITAL_COMMUNITY)
Admission: RE | Admit: 2021-08-20 | Discharge: 2021-08-20 | Disposition: A | Discharge status: Home / Self Care | Payer: Medicaid Other | Source: Ambulatory Visit | Attending: Obstetrics and Gynecology | Admitting: Obstetrics and Gynecology

## 2021-08-20 ENCOUNTER — Encounter: Payer: Self-pay | Admitting: Obstetrics and Gynecology

## 2021-08-20 ENCOUNTER — Other Ambulatory Visit: Payer: Self-pay

## 2021-08-20 ENCOUNTER — Ambulatory Visit (INDEPENDENT_AMBULATORY_CARE_PROVIDER_SITE_OTHER): Payer: Medicaid Other | Admitting: Obstetrics and Gynecology

## 2021-08-20 VITALS — BP 117/76 | HR 93 | Wt 160.0 lb

## 2021-08-20 DIAGNOSIS — O135 Gestational [pregnancy-induced] hypertension without significant proteinuria, complicating the puerperium: Secondary | ICD-10-CM

## 2021-08-20 DIAGNOSIS — O24430 Gestational diabetes mellitus in the puerperium, diet controlled: Secondary | ICD-10-CM | POA: Diagnosis not present

## 2021-08-20 DIAGNOSIS — Z975 Presence of (intrauterine) contraceptive device: Secondary | ICD-10-CM

## 2021-08-20 DIAGNOSIS — O139 Gestational [pregnancy-induced] hypertension without significant proteinuria, unspecified trimester: Secondary | ICD-10-CM

## 2021-08-20 DIAGNOSIS — O24419 Gestational diabetes mellitus in pregnancy, unspecified control: Secondary | ICD-10-CM

## 2021-08-20 MED ORDER — PRENATAL VITAMINS 28-0.8 MG PO TABS
1.0000 | ORAL_TABLET | Freq: Every day | ORAL | 11 refills | Status: AC
Start: 2021-08-20 — End: ?

## 2021-08-20 NOTE — Progress Notes (Signed)
    Post Partum Visit Note  Ruth Ellis is a 30 y.o. [phone] female who presents for a postpartum visit. She is 4 weeks postpartum following a normal spontaneous vaginal delivery.  I have fully reviewed the prenatal and intrapartum course. The delivery was at 37.4 gestational weeks.  Anesthesia: epidural. Postpartum course has been unremarkable. Baby is doing well. Baby is feeding by breast. Bleeding thin lochia. Bowel function is normal. Bladder function is normal. Patient is not sexually active. Contraception method is IUD. Postpartum depression screening: negative.   The pregnancy intention screening data noted above was reviewed. Potential methods of contraception were discussed. The patient elected to proceed with No data recorded.    Health Maintenance Due  Topic Date Due   URINE MICROALBUMIN  Never done   PAP SMEAR-Modifier  Never done   INFLUENZA VACCINE  06/11/2021    Medical record  Review of Systems Pertinent items noted in HPI and remainder of comprehensive ROS otherwise negative.  Objective:  BP 117/76   Pulse 93   Wt 160 lb (72.6 kg)   LMP 10/31/2020 (Exact Date)   Breastfeeding Yes   BMI 26.63 kg/m    General:  alert   Breasts:  not indicated  Lungs: clear to auscultation bilaterally  Heart:  regular rate and rhythm, S1, S2 normal, no murmur, click, rub or gallop  Abdomen: soft, non-tender; bowel sounds normal; no masses,  no organomegaly   Wound NA  GU exam:  normal, 3A laceration well healed, Pap smear obtained, IUD strins trimmed       Assessment:   NL postpartum exam.  H/O GHTN H/O GDM IUD in place  Plan:   Essential components of care per ACOG recommendations:  1.  Mood and well being: Patient with negative depression screening today. Reviewed local resources for support.  - Patient tobacco use? No.   - hx of drug use? No.    2. Infant care and feeding:  -Patient currently breastmilk feeding? Yes. Discussed returning to work and pumping.  Reviewed importance of draining breast regularly to support lactation.  -Social determinants of health (SDOH) reviewed in EPIC. No concerns  3. Sexuality, contraception and birth spacing - Patient does not want a pregnancy in the next year.  Desired family size is uncertain  Liletta placed 07/21/21 -- Discussed birth spacing of 18 months  4. Sleep and fatigue -Encouraged family/partner/community support of 4 hrs of uninterrupted sleep to help with mood and fatigue  5. Physical Recovery  - Discussed patients delivery and complications. She describes her labor as good. - Patient had a Vaginal, no problems at delivery. Patient had a 3rd degree laceration. Perineal healing reviewed. Patient expressed understanding - Patient has urinary incontinence? No. - Patient is safe to resume physical and sexual activity  6.  Health Maintenance - HM due items addressed Yes - Last pap smear No results found for: DIAGPAP Pap smear done at today's visit.  -Breast Cancer screening indicated? No.   7. Chronic Disease/Pregnancy Condition follow up: None  Glucola today Will complete current Procardia Rx and return in 2 weeks for nurse visit for BP check  Stark Klein for [employer], [facility]

## 2021-08-20 NOTE — Patient Instructions (Signed)
Health Maintenance, Female Adopting a healthy lifestyle and getting preventive care are important in promoting health and wellness. Ask your health care provider about: The right schedule for you to have regular tests and exams. Things you can do on your own to prevent diseases and keep yourself healthy. What should I know about diet, weight, and exercise? Eat a healthy diet  Eat a diet that includes plenty of vegetables, fruits, low-fat dairy products, and lean protein. Do not eat a lot of foods that are high in solid fats, added sugars, or sodium. Maintain a healthy weight Body mass index (BMI) is used to identify weight problems. It estimates body fat based on height and weight. Your health care provider can help determine your BMI and help you achieve or maintain a healthy weight. Get regular exercise Get regular exercise. This is one of the most important things you can do for your health. Most adults should: Exercise for at least 150 minutes each week. The exercise should increase your heart rate and make you sweat (moderate-intensity exercise). Do strengthening exercises at least twice a week. This is in addition to the moderate-intensity exercise. Spend less time sitting. Even light physical activity can be beneficial. Watch cholesterol and blood lipids Have your blood tested for lipids and cholesterol at 31 years of age, then have this test every 5 years. Have your cholesterol levels checked more often if: Your lipid or cholesterol levels are high. You are older than 31 years of age. You are at high risk for heart disease. What should I know about cancer screening? Depending on your health history and family history, you may need to have cancer screening at various ages. This may include screening for: Breast cancer. Cervical cancer. Colorectal cancer. Skin cancer. Lung cancer. What should I know about heart disease, diabetes, and high blood pressure? Blood pressure and heart  disease High blood pressure causes heart disease and increases the risk of stroke. This is more likely to develop in people who have high blood pressure readings, are of African descent, or are overweight. Have your blood pressure checked: Every 3-5 years if you are 18-39 years of age. Every year if you are 40 years old or older. Diabetes Have regular diabetes screenings. This checks your fasting blood sugar level. Have the screening done: Once every three years after age 40 if you are at a normal weight and have a low risk for diabetes. More often and at a younger age if you are overweight or have a high risk for diabetes. What should I know about preventing infection? Hepatitis B If you have a higher risk for hepatitis B, you should be screened for this virus. Talk with your health care provider to find out if you are at risk for hepatitis B infection. Hepatitis C Testing is recommended for: Everyone born from 1945 through 1965. Anyone with known risk factors for hepatitis C. Sexually transmitted infections (STIs) Get screened for STIs, including gonorrhea and chlamydia, if: You are sexually active and are younger than 31 years of age. You are older than 31 years of age and your health care provider tells you that you are at risk for this type of infection. Your sexual activity has changed since you were last screened, and you are at increased risk for chlamydia or gonorrhea. Ask your health care provider if you are at risk. Ask your health care provider about whether you are at high risk for HIV. Your health care provider may recommend a prescription medicine   to help prevent HIV infection. If you choose to take medicine to prevent HIV, you should first get tested for HIV. You should then be tested every 3 months for as long as you are taking the medicine. Pregnancy If you are about to stop having your period (premenopausal) and you may become pregnant, seek counseling before you get  pregnant. Take 400 to 800 micrograms (mcg) of folic acid every day if you become pregnant. Ask for birth control (contraception) if you want to prevent pregnancy. Osteoporosis and menopause Osteoporosis is a disease in which the bones lose minerals and strength with aging. This can result in bone fractures. If you are 65 years old or older, or if you are at risk for osteoporosis and fractures, ask your health care provider if you should: Be screened for bone loss. Take a calcium or vitamin D supplement to lower your risk of fractures. Be given hormone replacement therapy (HRT) to treat symptoms of menopause. Follow these instructions at home: Lifestyle Do not use any products that contain nicotine or tobacco, such as cigarettes, e-cigarettes, and chewing tobacco. If you need help quitting, ask your health care provider. Do not use street drugs. Do not share needles. Ask your health care provider for help if you need support or information about quitting drugs. Alcohol use Do not drink alcohol if: Your health care provider tells you not to drink. You are pregnant, may be pregnant, or are planning to become pregnant. If you drink alcohol: Limit how much you use to 0-1 drink a day. Limit intake if you are breastfeeding. Be aware of how much alcohol is in your drink. In the [place] one drink equals one 12 oz bottle of beer (355 mL), one 5 oz glass of wine (148 mL), or one 1 oz glass of hard liquor (44 mL). General instructions Schedule regular health, dental, and eye exams. Stay current with your vaccines. Tell your health care provider if: You often feel depressed. You have ever been abused or do not feel safe at home. Summary Adopting a healthy lifestyle and getting preventive care are important in promoting health and wellness. Follow your health care provider's instructions about healthy diet, exercising, and getting tested or screened for diseases. Follow your health care provider's  instructions on monitoring your cholesterol and blood pressure. This information is not intended to replace advice given to you by your health care provider. Make sure you discuss any questions you have with your health care provider. Document Revised: 01/05/2021 Document Reviewed: 10/21/2018 Elsevier Patient Education  2022 Elsevier Inc.  

## 2021-08-21 LAB — GLUCOSE TOLERANCE, 2 HOURS W/ 1HR
Glucose, 1 hour: 122 mg/dL (ref 70–179)
Glucose, 2 hour: 93 mg/dL (ref 70–152)
Glucose, Fasting: 76 mg/dL (ref 70–91)

## 2021-08-22 LAB — CYTOLOGY - PAP
Comment: NEGATIVE
Diagnosis: NEGATIVE
High risk HPV: NEGATIVE

## 2021-09-03 ENCOUNTER — Other Ambulatory Visit: Payer: Self-pay

## 2021-09-03 ENCOUNTER — Ambulatory Visit (INDEPENDENT_AMBULATORY_CARE_PROVIDER_SITE_OTHER): Payer: Medicaid Other

## 2021-09-03 VITALS — BP 131/87 | HR 76

## 2021-09-03 DIAGNOSIS — Z0130 Encounter for examination of blood pressure without abnormal findings: Secondary | ICD-10-CM

## 2021-09-03 NOTE — Progress Notes (Signed)
Agree with nurses's documentation of this patient's clinic encounter.  Shonika Kolasinski L, MD  

## 2021-09-03 NOTE — Progress Notes (Signed)
Subjective:  Ruth Ellis is a 30 y.o. female here for BP check s/p SVD on 07/21/2021.  Hypertension ROS: Pt discontinued Protonix as instructed, no TIA's, no chest pain on exertion, no dyspnea on exertion, and no swelling of ankles.   Objective:  BP 131/87 P 76  Appearance alert, well appearing, and in no distress. General exam BP noted to be well controlled today in office.    Assessment:   Blood Pressure well controlled.   Plan:   Follow up with PCP 2-3 months

## 2022-11-22 ENCOUNTER — Ambulatory Visit: Payer: Medicaid Other | Admitting: Obstetrics

## 2023-01-21 ENCOUNTER — Ambulatory Visit: Payer: Medicaid Other | Admitting: Obstetrics

## 2023-02-02 ENCOUNTER — Emergency Department (HOSPITAL_BASED_OUTPATIENT_CLINIC_OR_DEPARTMENT_OTHER)
Admission: EM | Admit: 2023-02-02 | Discharge: 2023-02-02 | Disposition: A | Discharge status: Home / Self Care | Payer: Medicaid Other | Attending: Emergency Medicine | Admitting: Emergency Medicine

## 2023-02-02 ENCOUNTER — Encounter (HOSPITAL_BASED_OUTPATIENT_CLINIC_OR_DEPARTMENT_OTHER): Payer: Self-pay | Admitting: Student

## 2023-02-02 ENCOUNTER — Other Ambulatory Visit: Payer: Self-pay

## 2023-02-02 DIAGNOSIS — R21 Rash and other nonspecific skin eruption: Secondary | ICD-10-CM | POA: Insufficient documentation

## 2023-02-02 MED ORDER — PREDNISONE 10 MG PO TABS: 20.0000 mg | ORAL_TABLET | Freq: Every day | ORAL | 0 refills | Status: AC

## 2023-02-02 NOTE — ED Notes (Signed)
Discharge paperwork given and verbally understood. 

## 2023-02-02 NOTE — ED Provider Notes (Signed)
[facility] Provider Note   CSN: [id number] Arrival date & time: 02/02/23  0940     History  Chief Complaint  Patient presents with   Rash    Ruth Ellis is a 32 y.o. female.  Past medical history of ADHD, anxiety, depression who presents to the emergency department with rash.  States that she started to develop a rash about 1 week ago.  She states that initially started on her chest and then has migrated throughout the week to her arms and back.  She describes it as small bumps that are itchy.  She denies having fever.  She denies having any rash develop in her mouth, nose, vagina or rectum.  She denies peeling of her skin.  She denies any new medications, lotion, detergents, soaps or foods.  She denies new clothing or bedding.  She denies any recent travel or hiking or camping.  Nobody else in the house has a rash.    HPI     Home Medications Prior to Admission medications   Medication Sig Start Date End Date Taking? Authorizing Provider  predniSONE (DELTASONE) 10 MG tablet Take 2 tablets (20 mg total) by mouth daily for 5 days. 02/02/23 02/07/23 Yes Mickie Hillier, PA-C  acetaminophen (TYLENOL) 325 MG tablet Take 2 tablets (650 mg total) by mouth every 4 (four) hours as needed (for pain scale < 4). Patient not taking: Reported on 09/03/2021 07/23/21   Eppie Gibson, MD  ibuprofen (ADVIL) 200 MG tablet Take 3 tablets (600 mg total) by mouth every 6 (six) hours. Patient not taking: Reported on 09/03/2021 07/23/21   Eppie Gibson, MD  polyethylene glycol (MIRALAX) 17 g packet Take 17 g by mouth daily. Patient not taking: Reported on 09/03/2021 07/23/21   Renard Matter, MD  Prenatal Vit-Fe Fumarate-FA (PRENATAL VITAMINS) 28-0.8 MG TABS Take 1 tablet by mouth daily. 08/20/21   Chancy Milroy, MD      Allergies    Kiwi extract    Review of Systems   Review of Systems  Skin:  Positive for rash.  All other systems reviewed and are  negative.   Physical Exam Updated Vital Signs BP (!) 143/96   Pulse 93   Temp 98.4 F (36.9 C) (Oral)   Resp 20   SpO2 100%  Physical Exam Vitals and nursing note reviewed.  HENT:     Head: Normocephalic and atraumatic.  Eyes:     General: No scleral icterus. Pulmonary:     Effort: Pulmonary effort is normal. No respiratory distress.  Skin:    General: Skin is warm and dry.     Findings: No rash.     Comments: Few scattered papules on the skin that are nonerythematous on the left shoulder, chest, right upper arm.  She has a few older papules that have been scabbed over from scratching.  There is no urticaria, angioedema.  No involvement of the mucous membranes.  No skin peeling.  Neurological:     General: No focal deficit present.     Mental Status: She is alert and oriented to person, place, and time.  Psychiatric:        Mood and Affect: Mood normal.        Behavior: Behavior normal.        Thought Content: Thought content normal.        Judgment: Judgment normal.     ED Results / Procedures / Treatments   Labs (all  labs ordered are listed, but only abnormal results are displayed) Labs Reviewed - No data to display  EKG None  Radiology No results found.  Procedures Procedures   Medications Ordered in ED Medications - No data to display  ED Course/ Medical Decision Making/ A&P    Medical Decision Making Risk Prescription drug management.  Initial Impression and Ddx 32 year old female who presents to the emergency department with rash Patient PMH that increases complexity of ED encounter: ADHD, anxiety, depression Differential: Contact dermatitis, SJS, TEN, erythema multiforme, tickborne illness, urticaria, viral exanthem, autoimmune, herpes zoster, petechial rash, drug rash, staph scalded skin syndrome, cellulitis, dress syndrome, etc.  Interpretation of Diagnostics I independent reviewed and interpreted the labs as followed: Not indicated  - I  independently visualized the following imaging with scope of interpretation limited to determining acute life threatening conditions related to emergency care: Not indicated  Patient Reassessment and Ultimate Disposition/Management Well-appearing, nonseptic and nontoxic appearing female who presents to the emergency department with rash.  There is very minimal rash to even evaluate.  There are few papules on her upper arms and back and chest.  There is no Nikolsky sign concerning for SJS or TEN or staph scalded skin syndrome.  She is afebrile and rash is not consistent with etiologies like dress syndrome.  She has no angioedema or anaphylaxis.  Not a petechial rash.  Not a vesicular rash concerning for zoster.  Is not pustular.  No risk factors for Lyme or [facility] spotted fever or syphilis.  Rash is not consistent with something like psoriasis or eczema or scabies.  Unclear etiology of her rash but she is overall well-appearing.  Will place her on prednisone for the next week and she is instructed to use Benadryl for itching.  Will have her follow-up with primary care for ultimate allergy testing if needed.  She does have a family history of lupus but rash is inconsistent with that at this time.  I have given her return precautions for worsening symptoms, fever, skin peeling, involvement of the mucous membranes.  She verbalized understanding.  The patient has been appropriately medically screened and/or stabilized in the ED. I have low suspicion for any other emergent medical condition which would require further screening, evaluation or treatment in the ED or require inpatient management. At time of discharge the patient is hemodynamically stable and in no acute distress. I have discussed work-up results and diagnosis with patient and answered all questions. Patient is agreeable with discharge plan. We discussed strict return precautions for returning to the emergency department and they verbalized  understanding.     Patient management required discussion with the following services or consulting groups:  None  Complexity of Problems Addressed Acute uncomplicated illness or injury with no diagnostics  Additional Data Reviewed and Analyzed Further history obtained from: Past medical history and medications listed in the EMR and Care Everywhere  Patient Encounter Risk Assessment Prescriptions  Final Clinical Impression(s) / ED Diagnoses Final diagnoses:  Rash    Rx / DC Orders ED Discharge Orders          Ordered    predniSONE (DELTASONE) 10 MG tablet  Daily        02/02/23 1007              Mickie Hillier, PA-C 02/02/23 1015    Ezequiel Essex, MD 02/02/23 1108

## 2023-02-02 NOTE — Discharge Instructions (Signed)
You were seen in the department today for rash.  I am placing on steroids over the next 5 days.  Please start taking Benadryl for itching.  This may make you drowsy.  You can take this on a schedule over the next few days to help with your symptoms.  Please follow-up with your primary care provider to have allergy testing as needed or testing for lupus as this is a concern regarding her family.  Please return to the emergency department if you have rash involving her mouth, nose, eyes, vagina or rectum or if you have peeling of your skin or fever.

## 2023-02-02 NOTE — ED Triage Notes (Signed)
Pt comes in today due to complaints of rash, started about a week ago, on her left shoulder.back and now face. It has been itchy at times.

## 2023-02-21 ENCOUNTER — Encounter: Payer: Self-pay | Admitting: Obstetrics

## 2023-02-21 ENCOUNTER — Other Ambulatory Visit (HOSPITAL_COMMUNITY)
Admission: RE | Admit: 2023-02-21 | Discharge: 2023-02-21 | Disposition: A | Discharge status: Home / Self Care | Payer: Medicaid Other | Source: Ambulatory Visit | Attending: Obstetrics | Admitting: Obstetrics

## 2023-02-21 ENCOUNTER — Ambulatory Visit (INDEPENDENT_AMBULATORY_CARE_PROVIDER_SITE_OTHER): Payer: Medicaid Other | Admitting: Obstetrics

## 2023-02-21 VITALS — BP 124/80 | HR 90 | Ht 65.0 in | Wt 127.1 lb

## 2023-02-21 DIAGNOSIS — N898 Other specified noninflammatory disorders of vagina: Secondary | ICD-10-CM | POA: Insufficient documentation

## 2023-02-21 DIAGNOSIS — Z113 Encounter for screening for infections with a predominantly sexual mode of transmission: Secondary | ICD-10-CM | POA: Diagnosis not present

## 2023-02-21 DIAGNOSIS — J301 Allergic rhinitis due to pollen: Secondary | ICD-10-CM | POA: Diagnosis not present

## 2023-02-21 DIAGNOSIS — Z01419 Encounter for gynecological examination (general) (routine) without abnormal findings: Secondary | ICD-10-CM

## 2023-02-21 MED ORDER — CETIRIZINE HCL 10 MG PO TABS: 10.0000 mg | ORAL_TABLET | Freq: Every day | ORAL | 11 refills | Status: AC

## 2023-02-21 NOTE — Progress Notes (Signed)
Subjective:        Ruth Ellis is a 32 y.o. female here for a routine exam.  Current complaints: Vaginal discharge.    Personal health questionnaire:  Is patient Ashkenazi Jewish, have a family history of breast and/or ovarian cancer: no Is there a family history of uterine cancer diagnosed at age < 66, gastrointestinal cancer, urinary tract cancer, family member who is a Personnel officer syndrome-associated carrier: no Is the patient overweight and hypertensive, family history of diabetes, personal history of gestational diabetes, preeclampsia or PCOS: no Is patient over 55, have PCOS,  family history of premature CHD under age 54, diabetes, smoke, have hypertension or peripheral artery disease:  no At any time, has a partner hit, kicked or otherwise hurt or frightened you?: no Over the past 2 weeks, have you felt down, depressed or hopeless?: no Over the past 2 weeks, have you felt little interest or pleasure in doing things?:no   Gynecologic History No LMP recorded. (Menstrual status: IUD). Contraception: IUD Last Pap: 2022. Results were: normal Last mammogram: n/a. Results were: n/a  Obstetric History OB History  Gravida Para Term Preterm AB Living  6 1 1   5 1   SAB IAB Ectopic Multiple Live Births  3 2   0 1    # Outcome Date GA Lbr Len/2nd Weight Sex Delivery Anes PTL Lv  6 Term 07/21/21 [redacted]w[redacted]d 04:46 / 01:34 7 lb 6.9 oz (3.37 kg) M Vag-Spont EPI  LIV  5 SAB 01/01/17 [redacted]w[redacted]d            Complications: Chromosomal abnormality  4 SAB 07/20/16 [redacted]w[redacted]d   F    ND     Complications: Preterm premature rupture of membranes (PPROM) with unknown onset of labor  3 SAB 08/01/15 [redacted]w[redacted]d         2 IAB  [redacted]w[redacted]d            Complications: History of D&C  1 IAB             Obstetric Comments  PPROM/Cervical insufficiency    Past Medical History:  Diagnosis Date   ADHD    Alpha thalassemia silent carrier 03/03/2021   Anxiety    Cervical insufficiency during pregnancy, antepartum, third trimester  02/16/2021   Per MFM, needs cervical length every 2 weeks   Depression    Supervision of high risk pregnancy, antepartum 02/15/2021    Nursing Staff Provider Office Location [facility] Dating  LMP: 10/31/20 EDC: 08/07/21 Language  English Anatomy US  normal Flu Vaccine   Genetic Screen  NIPS: Low risk BOY AFP:  normal  TDaP Vaccine  05/01/21 Hgb A1C or  GTT Early A1c: 5.6 Third trimester abnormal COVID Vaccine    LAB RESULTS  Rhogam  NA Blood Type --/--/O POS (04/27 0945)  Feeding Plan Breast  Antibody NEG (04/27 0945) Contraception I   Vaginal Pap smear, abnormal     Past Surgical History:  Procedure Laterality Date   CERVICAL CERCLAGE N/A 03/07/2021   Procedure: CERCLAGE CERVICAL;  Surgeon:  Bing, MD;  Location: [facility] LD ORS;  Service: Gynecology;  Laterality: N/A;  EDC 08-07-21. Will be 18+1 on DOS.   CRYOTHERAPY     DILATION AND CURETTAGE OF UTERUS       Current Outpatient Medications:    amphetamine-dextroamphetamine (ADDERALL XR) 20 MG 24 hr capsule, Take 20 mg by mouth daily., Disp: , Rfl:    buPROPion (WELLBUTRIN XL) 150 MG 24 hr tablet, Take 150 mg by mouth daily.,  Disp: , Rfl:    Vitamin D, Ergocalciferol, (DRISDOL) 1.25 MG (50000 UNIT) CAPS capsule, Take 50,000 Units by mouth every 7 (seven) days., Disp: , Rfl:    acetaminophen (TYLENOL) 325 MG tablet, Take 2 tablets (650 mg total) by mouth every 4 (four) hours as needed (for pain scale < 4). (Patient not taking: Reported on 09/03/2021), Disp: , Rfl:    ibuprofen (ADVIL) 200 MG tablet, Take 3 tablets (600 mg total) by mouth every 6 (six) hours. (Patient not taking: Reported on 09/03/2021), Disp: , Rfl:    polyethylene glycol (MIRALAX) 17 g packet, Take 17 g by mouth daily. (Patient not taking: Reported on 09/03/2021), Disp: 14 each, Rfl: 0   Prenatal Vit-Fe Fumarate-FA (PRENATAL VITAMINS) 28-0.8 MG TABS, Take 1 tablet by mouth daily. (Patient not taking: Reported on 02/21/2023), Disp: 30 tablet, Rfl: 11 Allergies  Allergen Reactions    Kiwi Extract Other (See Comments)    Fruit -  Throat itches    Social History   Tobacco Use   Smoking status: Never   Smokeless tobacco: Never  Substance Use Topics   Alcohol use: Not Currently    Comment: Social    Family History  Problem Relation Age of Onset   Diabetes Mother    Heart disease Mother    Renal Disease Mother    Diabetes Father       Review of Systems  Constitutional: negative for fatigue and weight loss Respiratory: negative for cough and wheezing Cardiovascular: negative for chest pain, fatigue and palpitations Gastrointestinal: negative for abdominal pain and change in bowel habits Musculoskeletal:negative for myalgias Neurological: negative for gait problems and tremors Behavioral/Psych: negative for abusive relationship, depression Endocrine: negative for temperature intolerance    Genitourinary: positive for vaginal discharge.  negative for abnormal menstrual periods, genital lesions, hot flashes, sexual problems  Integument/breast: negative for breast lump, breast tenderness, nipple discharge and skin lesion(s)    Objective:       BP 124/80   Pulse 90   Ht  (1.651 m)   Wt 127 lb 1.6 oz (57.7 kg)   BMI 21.15 kg/m  General:   Alert and no distress  Skin:   no rash or abnormalities  Lungs:   clear to auscultation bilaterally  Heart:   regular rate and rhythm, S1, S2 normal, no murmur, click, rub or gallop  Breasts:   normal without suspicious masses, skin or nipple changes or axillary nodes  Abdomen:  normal findings: no organomegaly, soft, non-tender and no hernia  Pelvis:  External genitalia: normal general appearance Urinary system: urethral meatus normal and bladder without fullness, nontender Vaginal: normal without tenderness, induration or masses Cervix: normal appearance Adnexa: normal bimanual exam Uterus: anteverted and non-tender, normal size   Lab Review Urine pregnancy test Labs reviewed yes Radiologic studies reviewed  yes  I have spent a total of 20 minutes of face-to-face, excluding clinical staff time, reviewing notes and preparing to see patient, ordering tests and/or medications, and counseling the patient.   Assessment:    1. Encounter for gynecological examination with Papanicolaou smear of cervix Rx: - Cytology - PAP( [facility])  2. Vaginal discharge Rx: - Cervicovaginal ancillary only( )  3. Screen for STD (sexually transmitted disease) Rx: - Hepatitis B Surface AntiGEN - Hepatitis C Antibody - HIV Antibody (routine testing w rflx) - RPR  4. Seasonal allergic rhinitis due to pollen Rx: - cetirizine (ZYRTEC ALLERGY) 10 MG tablet; Take 1 tablet (10 mg total) by mouth  daily.  Dispense: 30 tablet; Refill: 11     Plan:    Education reviewed: calcium supplements, depression evaluation, low fat, low cholesterol diet, safe sex/STD prevention, self breast exams, and weight bearing exercise. Contraception: IUD. Follow up in: 1 year.   No orders of the defined types were placed in this encounter.  Orders Placed This Encounter  Procedures   Hepatitis B Surface AntiGEN   Hepatitis C Antibody   HIV Antibody (routine testing w rflx)   RPR    Brock Bad, MD 02/21/2023 8:43 AM

## 2023-02-21 NOTE — Progress Notes (Signed)
Pt is in the office for annual Last pap 08/20/2021 Currently has Liletta IUD, inserted in 2022 Desires full std panel today

## 2023-02-22 LAB — RPR: RPR Ser Ql: NONREACTIVE

## 2023-02-22 LAB — HEPATITIS C ANTIBODY: Hep C Virus Ab: NONREACTIVE

## 2023-02-22 LAB — HIV ANTIBODY (ROUTINE TESTING W REFLEX): HIV Screen 4th Generation wRfx: NONREACTIVE

## 2023-02-22 LAB — HEPATITIS B SURFACE ANTIGEN: Hepatitis B Surface Ag: NEGATIVE

## 2023-02-25 LAB — CERVICOVAGINAL ANCILLARY ONLY
Bacterial Vaginitis (gardnerella): NEGATIVE
Candida Glabrata: NEGATIVE
Candida Vaginitis: NEGATIVE
Chlamydia: NEGATIVE
Comment: NEGATIVE
Comment: NEGATIVE
Comment: NEGATIVE
Comment: NEGATIVE
Comment: NEGATIVE
Comment: NORMAL
Neisseria Gonorrhea: NEGATIVE
Trichomonas: NEGATIVE

## 2023-02-26 LAB — CYTOLOGY - PAP
Comment: NEGATIVE
Diagnosis: NEGATIVE
High risk HPV: NEGATIVE

## 2023-06-09 ENCOUNTER — Ambulatory Visit (INDEPENDENT_AMBULATORY_CARE_PROVIDER_SITE_OTHER): Payer: Medicaid Other | Admitting: Licensed Clinical Social Worker

## 2023-06-09 ENCOUNTER — Encounter (HOSPITAL_COMMUNITY): Payer: Self-pay

## 2023-06-09 DIAGNOSIS — F900 Attention-deficit hyperactivity disorder, predominantly inattentive type: Secondary | ICD-10-CM

## 2023-06-09 DIAGNOSIS — F411 Generalized anxiety disorder: Secondary | ICD-10-CM | POA: Diagnosis not present
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
## 2023-07-04 ENCOUNTER — Ambulatory Visit (INDEPENDENT_AMBULATORY_CARE_PROVIDER_SITE_OTHER): Payer: Medicaid Other | Admitting: Licensed Clinical Social Worker

## 2023-07-04 DIAGNOSIS — F900 Attention-deficit hyperactivity disorder, predominantly inattentive type: Secondary | ICD-10-CM

## 2023-07-04 DIAGNOSIS — F411 Generalized anxiety disorder: Secondary | ICD-10-CM | POA: Diagnosis not present

## 2023-07-04 NOTE — Progress Notes (Signed)
THERAPIST PROGRESS NOTE  Virtual Visit via Video Note  I connected with Ruth Ellis on 07/04/23 at 10:00 AM EDT by a video enabled telemedicine application and verified that I am speaking with the correct person using two identifiers.  Location: Patient: [facility]  Provider: Providers Home    I discussed the limitations of evaluation and management by telemedicine and the availability of in person appointments. The patient expressed understanding and agreed to proceed.    I discussed the assessment and treatment plan with the patient. The patient was provided an opportunity to ask questions and all were answered. The patient agreed with the plan and demonstrated an understanding of the instructions.   The patient was advised to call back or seek an in-person evaluation if the symptoms worsen or if the condition fails to improve as anticipated.  I provided 30 minutes of non-face-to-face time during this encounter.   Ruth Cooks, LCSW   Participation Level: Active  Behavioral Response: CasualAlertAnxious and Depressed  Type of Therapy: Individual Therapy  Treatment Goals addressed:  Active     Anxiety     LTG: Ruth Ellis will score less than 5 on the Generalized Anxiety Disorder 7 Scale (GAD-7)      Start:  06/09/23    Expected End:  11/14/23         STG: Ruth Ellis will attend at least 80% of scheduled group psychotherapy sessions      Start:  06/09/23    Expected End:  11/14/23         3 triggers for anxiety      Start:  06/09/23    Expected End:  11/14/23         3 coping skills for anxiety      Start:  06/09/23    Expected End:  11/14/23         Discuss risks and benefits of medication treatment options for this problem and prescribe as indicated     Start:  06/09/23         Encourage Ruth Ellis to take psychotropic medication(s) as prescribed     Start:  06/09/23         Review results of GAD-7 with Ruth Ellis to track progress     Start:   06/09/23         Work with Ruth Ellis to track symptoms, triggers, and/or skill use through a mood chart, diary card, or journal     Start:  06/09/23         Perform psychoeducation regarding anxiety disorders     Start:  06/09/23         Provide Ruth Ellis with educational information and reading material on anxiety, its causes, and symptoms.      Start:  06/09/23         Work with patient individually to identify the major components of a recent episode of anxiety: physical symptoms, major thoughts and images, and major behaviors they experienced     Start:  06/09/23         Work with Ruth Ellis to identify 3 personal goals for managing their anxiety to work on during current treatment.      Start:  06/09/23            ProgressTowards Goals: Progressing  Interventions: CBT, Motivational Interviewing, and Supportive   Suicidal/Homicidal: Nowithout intent/plan  Therapist Response:    Pt was alert and oriented x 5. She was dressed casually and engaged well in therapy session. She presented  with depressed and anxious mood/affect. Ruth Ellis was pleasant, cooperative, and maintained good eye contact.   Pt reports primary stressors are child, work and communication with spouse. Ruth Ellis states that her spouse is a truck driver that spends 5 days out of the week on the road. Pt reports that he has been home as of late due to his truck being in the shop. Pt states "There is a lot of tension and missed communication in the house". She reports she tries to provide him with the responsibilities, but things are getting missed. Pt reports this morning she was frustrated cause he was going to take their child to daycare but pt needed to get to work. She states that they all needed to go together to be on time and not make two separate trips. Spouse wanted to take their son to day care and then come back for her. Ruth Ellis reports that this was frustrating.  Other stressor was for child not  hitting developmental mild stones. She reports taking son to an occupational therapist who stated they see nothing wrong, but when pt stated she had ADHD the OT stated to pt "I can see early signs of ADHD". This concerned pt as he is only 33 years old.  Interventions/Plan: LCSW spoke with about communication styles such as writing schedule down for spouse and her both to see. LCSW spoke with pt about organization to help decrease tension and worry. LCSW educated pt on boundary setting such as knowing limits.      Plan: Return again in 3 weeks.  Diagnosis: Generalized anxiety disorder  Attention deficit hyperactivity disorder (ADHD), predominantly inattentive type  Collaboration of Care: Other None today   Patient/Guardian was advised Release of Information must be obtained prior to any record release in order to collaborate their care with an outside provider. Patient/Guardian was advised if they have not already done so to contact the registration department to sign all necessary forms in order for [place] to release information regarding their care.   Consent: Patient/Guardian gives verbal consent for treatment and assignment of benefits for services provided during this visit. Patient/Guardian expressed understanding and agreed to proceed.   Ruth Cooks, LCSW 07/04/2023

## 2023-07-21 ENCOUNTER — Ambulatory Visit (INDEPENDENT_AMBULATORY_CARE_PROVIDER_SITE_OTHER): Payer: Medicaid Other | Admitting: Licensed Clinical Social Worker

## 2023-07-21 DIAGNOSIS — F411 Generalized anxiety disorder: Secondary | ICD-10-CM

## 2023-07-21 DIAGNOSIS — F900 Attention-deficit hyperactivity disorder, predominantly inattentive type: Secondary | ICD-10-CM

## 2023-07-21 NOTE — Progress Notes (Signed)
THERAPIST PROGRESS NOTE  Virtual Visit via Video Note  I connected with Ruth Ellis on 07/21/23 at  2:00 PM EDT by a video enabled telemedicine application and verified that I am speaking with the correct person using two identifiers.  Location: Patient: [facility]  Provider: Providers Home    I discussed the limitations of evaluation and management by telemedicine and the availability of in person appointments. The patient expressed understanding and agreed to proceed.     I discussed the assessment and treatment plan with the patient. The patient was provided an opportunity to ask questions and all were answered. The patient agreed with the plan and demonstrated an understanding of the instructions.   The patient was advised to call back or seek an in-person evaluation if the symptoms worsen or if the condition fails to improve as anticipated.  I provided 30 minutes of non-face-to-face time during this encounter.   Weber Cooks, LCSW   Participation Level: Active  Behavioral Response: CasualAlertAnxious  Type of Therapy: Individual Therapy  Treatment Goals addressed:  Active     Anxiety     LTG: Ruth Ellis will score less than 5 on the Generalized Anxiety Disorder 7 Scale (GAD-7)      Start:  06/09/23    Expected End:  11/14/23         STG: Ruth Ellis will attend at least 80% of scheduled group psychotherapy sessions  (Progressing)     Start:  06/09/23    Expected End:  11/14/23         3 triggers for anxiety  (Progressing)     Start:  06/09/23    Expected End:  11/14/23       Goal Note     1, financials          3 coping skills for anxiety      Start:  06/09/23    Expected End:  11/14/23         Discuss risks and benefits of medication treatment options for this problem and prescribe as indicated     Start:  06/09/23         Encourage Ruth Ellis to take psychotropic medication(s) as prescribed     Start:  06/09/23         Review  results of GAD-7 with Ruth Ellis to track progress     Start:  06/09/23         Work with Ruth Ellis to track symptoms, triggers, and/or skill use through a mood chart, diary card, or journal     Start:  06/09/23         Perform psychoeducation regarding anxiety disorders     Start:  06/09/23         Provide Ruth Ellis with educational information and reading material on anxiety, its causes, and symptoms.      Start:  06/09/23         Work with patient individually to identify the major components of a recent episode of anxiety: physical symptoms, major thoughts and images, and major behaviors they experienced     Start:  06/09/23         Work with Ruth Ellis to identify 3 personal goals for managing their anxiety to work on during current treatment.      Start:  06/09/23            ProgressTowards Goals: Progressing  Interventions: CBT and Motivational Interviewing  Suicidal/Homicidal: Nowithout intent/plan  Therapist Response:   Pt was alert and oriented  x 5. She was dressed casually and engaged well in therapy session. She presented with depressed and anxious mood/affect. She was pleasant, cooperative, and maintained good eye contact.   Pt reports primary stressor as financials. She reports that her and her spouse have separate bank account and take on bills separately. Ruth Ellis states she attempts to talk about budgeting, but it has not been successful. She states part of the problem is they got behind and fees added up while she was not working. Pt states they are now starting to catch up, but she does not want to leave paycheck to pay check forever. Other stressor are grief and loss of her daughter who died 7 years ago.   Interventions/Plan: Closure letter where pt writes to the person they are grieving. States the letter out loud and then burns the letter. LCSW educated pt on the stages of grief/loss. LCSW educated pt on implementing "we" language with spouse instead of "you  or I" language.     Plan: Return again in 3 weeks.  Diagnosis: Generalized anxiety disorder  Attention deficit hyperactivity disorder (ADHD), predominantly inattentive type  Collaboration of Care: Other None today  Patient/Guardian was advised Release of Information must be obtained prior to any record release in order to collaborate their care with an outside provider. Patient/Guardian was advised if they have not already done so to contact the registration department to sign all necessary forms in order for [place] to release information regarding their care.   Consent: Patient/Guardian gives verbal consent for treatment and assignment of benefits for services provided during this visit. Patient/Guardian expressed understanding and agreed to proceed.   Weber Cooks, LCSW 07/21/2023

## 2023-07-23 IMAGING — US US MFM OB FOLLOW-UP
1 series · 13 of 28 positions shown · non-contrast
Comparison: none

[Series 1: us mfm ob follow-up · 44 acquisitions, 13 frames shown]
[im 2/44]
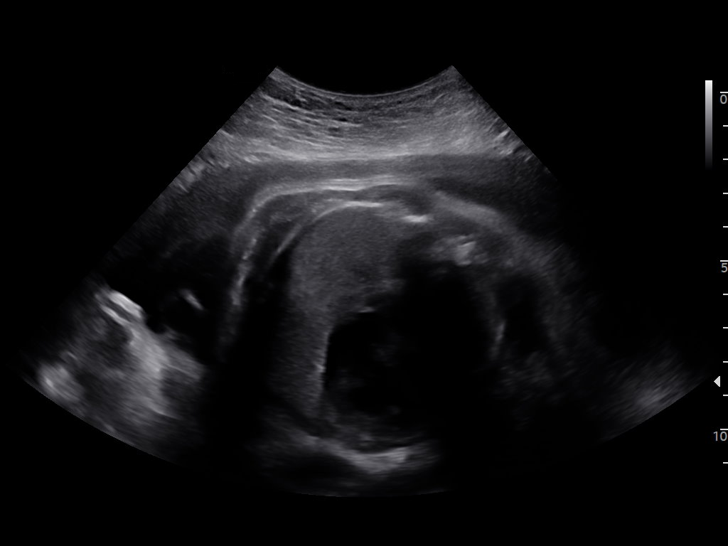
[im 5/44]
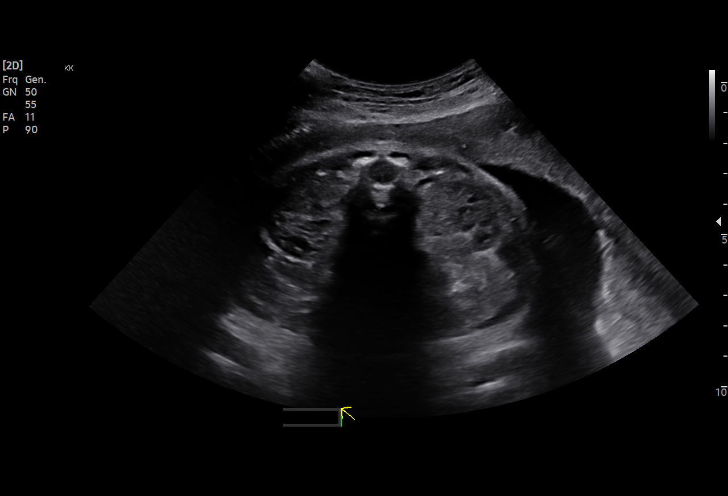
[im 8/44]
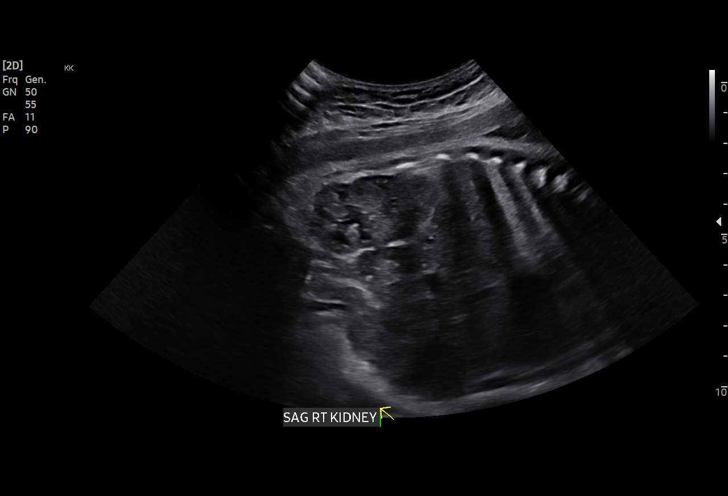
[im 12/44]
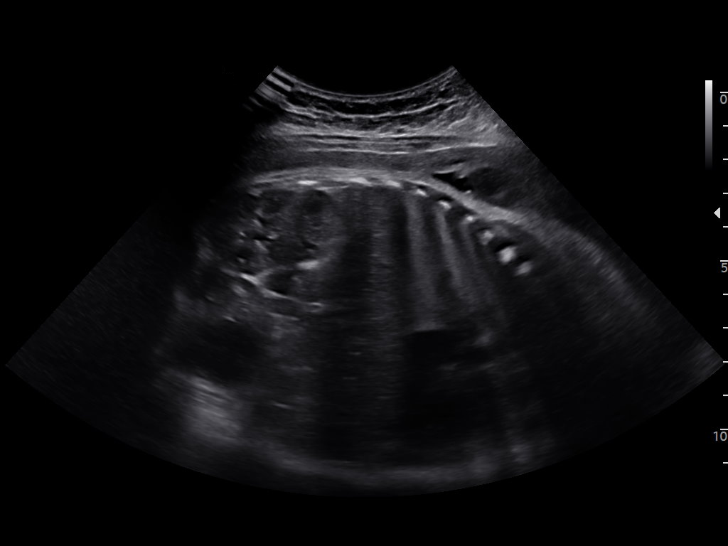
[im 15/44]
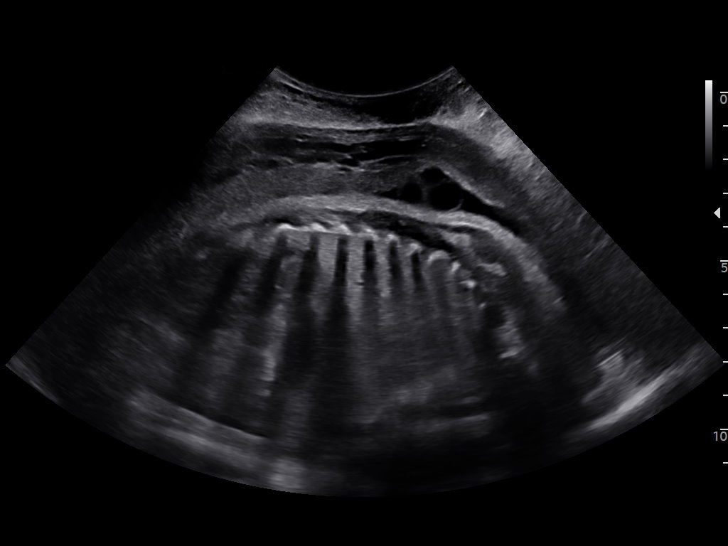
[im 18/44]
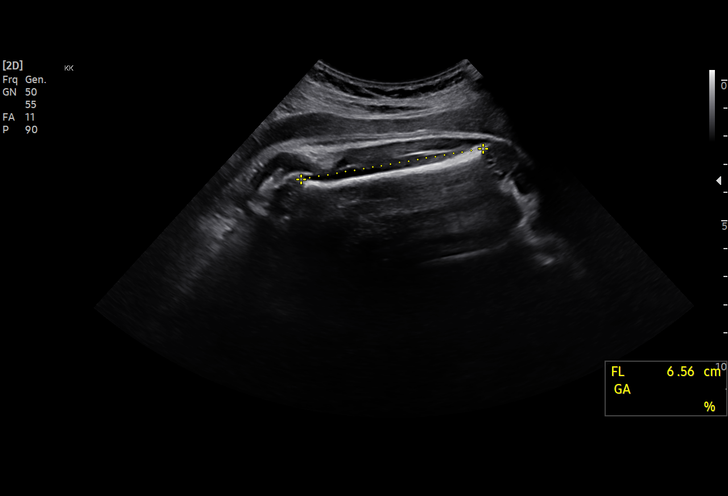
[im 23/44]
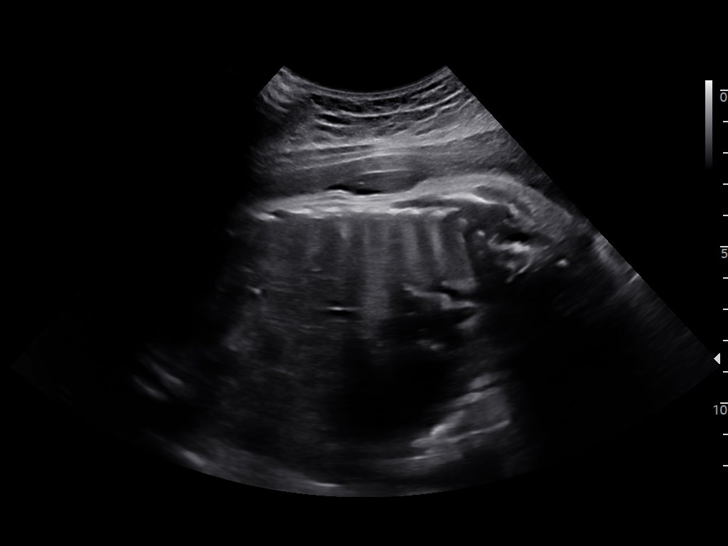
[im 26/44]
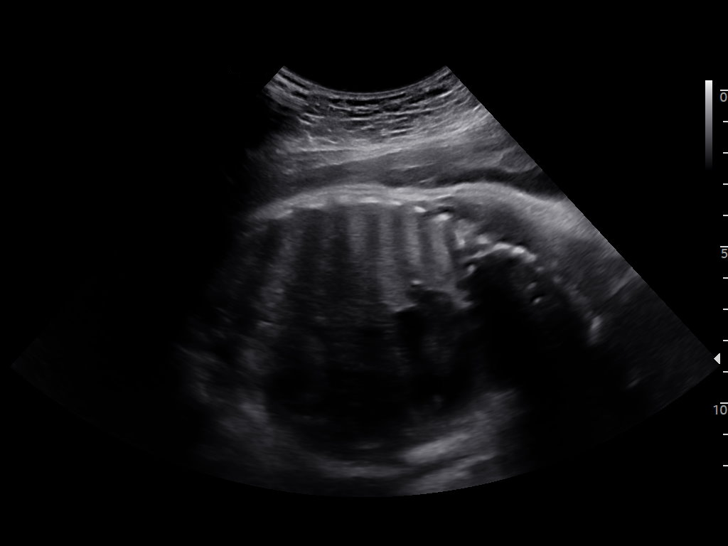
[im 29/44]
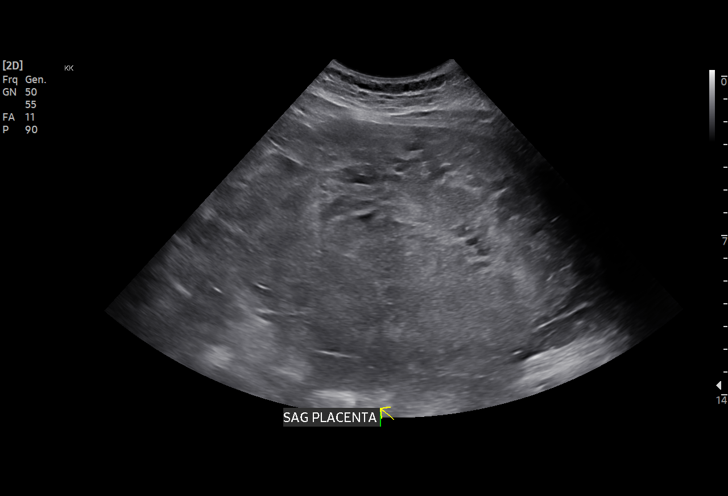
[im 32/44]
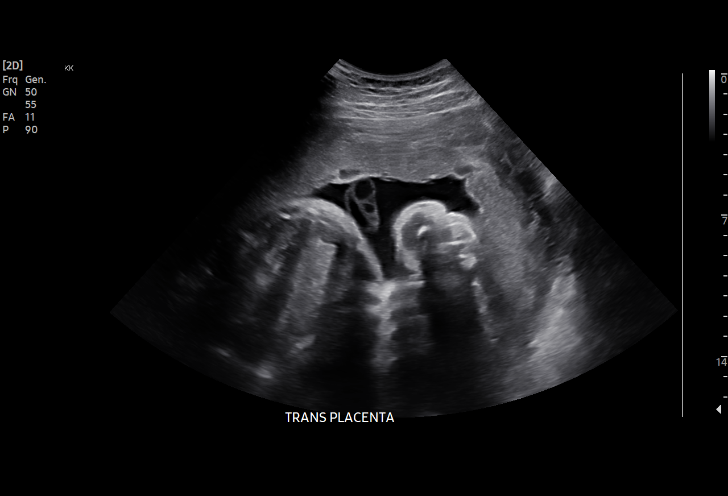
[im 36/44]
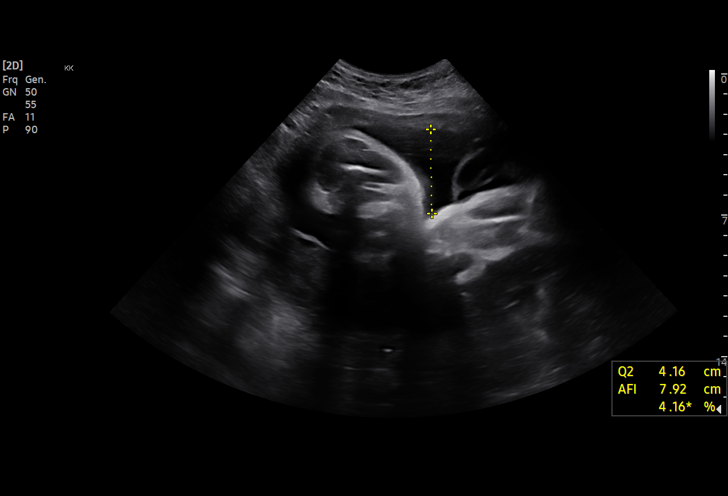
[im 39/44]
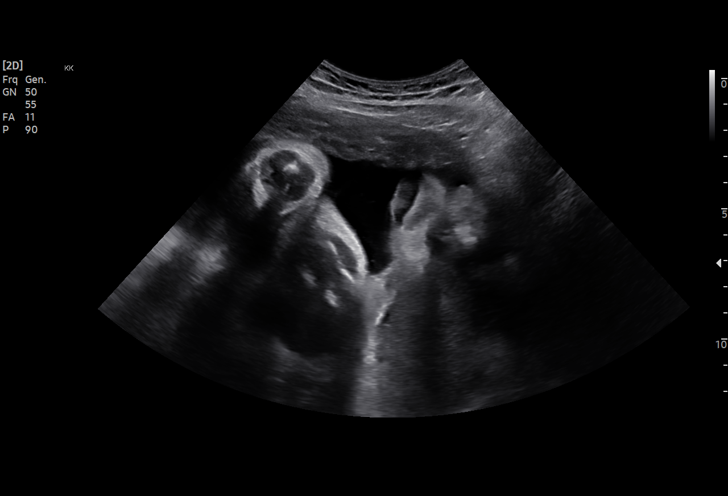
[im 42/44]
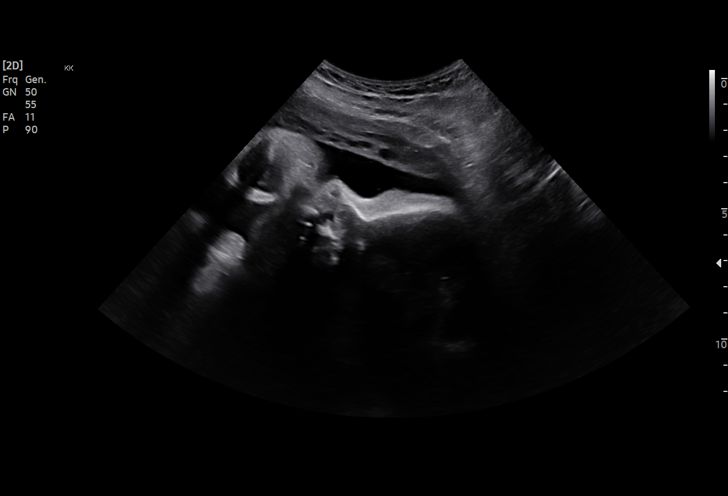

[13 of 28 positions shown; findings below may reference images not displayed]

BRACK TIGER NP
                   [id number]

Indications

 Gestational diabetes in pregnancy,
 controlled by oral hypoglycemic drugs
 33 weeks gestation of pregnancy
 Cervical insufficiency, 2nd (cerclage placed
 03/07/21)
 Poor obstetric history-Recurrent (habitual)
 abortion (3 consecutive ab's)
 Low Risk NIPS(Negative AFP)
 Genetic carrier (Adriansson, Sorayya cell)
 Encounter for other antenatal screening
 follow-up
Fetal Evaluation

 Num Of Fetuses:         1
 Fetal Heart Rate(bpm):  150
 Cardiac Activity:       Observed
 Presentation:           Cephalic
 Placenta:               Posterior
 P. Cord Insertion:      Previously Visualized
 Amniotic Fluid
 AFI FV:      Within normal limits

 AFI Sum(cm)     %Tile       Largest Pocket(cm)
 15.55           56

 RUQ(cm)       RLQ(cm)       LUQ(cm)        LLQ(cm)

Biophysical Evaluation

 Amniotic F.V:   Pocket => 2 cm             F. Tone:        Observed
 F. Movement:    Observed                   Score:          [DATE]
 F. Breathing:   Observed
Biometry

 BPD:      85.7  mm     G. Age:  34w 4d         80  %    CI:        80.02   %    70 - 86
                                                         FL/HC:      21.6   %    19.9 -
 HC:      302.7  mm     G. Age:  33w 4d         22  %    HC/AC:      0.97        0.96 -
 AC:      312.3  mm     G. Age:  35w 1d         93  %    FL/BPD:     76.4   %    71 - 87
 FL:       65.5  mm     G. Age:  33w 5d         52  %    FL/AC:      21.0   %    20 - 24

 Est. FW:    1941  gm      5 lb 7 oz     80  %
OB History

 Gravidity:    6         Term:   0        Prem:   0        SAB:   3
 TOP:          2       Ectopic:  0        Living: 0
Gestational Age

 LMP:           33w 2d        Date:  10/31/20                 EDD:   08/07/21
 U/S Today:     34w 2d                                        EDD:   07/31/21
 Best:          33w 2d     Det. By:  LMP  (10/31/20)          EDD:   08/07/21
Anatomy

 Cranium:               Appears normal         LVOT:                   Previously seen
 Cavum:                 Previously seen        Aortic Arch:            Previously seen
 Ventricles:            Previously seen        Ductal Arch:            Previously seen
 Choroid Plexus:        Previously seen        Diaphragm:              Appears normal
 Cerebellum:            Previously seen        Stomach:                Appears normal, left
                                                                       sided
 Posterior Fossa:       Previously seen        Abdomen:                Appears normal
 Nuchal Fold:           Previously seen        Abdominal Wall:         Previously seen
 Face:                  Orbits and profile     Cord Vessels:           Previously seen
                        previously seen
 Lips:                  Previously seen        Kidneys:                Appear normal
 Palate:                Previously seen        Bladder:                Appears normal
 Thoracic:              Appears normal         Spine:                  Previously seen
 Heart:                 Appears normal         Upper Extremities:      Previously seen
                        (4CH, axis, and
                        situs)
 RVOT:                  Previously seen        Lower Extremities:      Previously seen

 Other:  Fetus appears to be a male. Heels/feet, Open hands/5th digits, VC,
         3VV, 3VTV, Nasal bone, and Lenses previously visualized.
Cervix Uterus Adnexa

 Cervix
 Not visualized (advanced GA >91wks)
Impression

 Gestational diabetes.  Patient takes metformin 2111 mg twice
 daily.  She reports her blood glucose control is still not well
 controlled.  She will consider insulin after discussing with the
 doctor at her next prenatal visit.

 Fetal growth is appropriate for gestational age.  Amniotic fluid
 is normal good fetal activity seen.  Antenatal testing is
 reassuring.  BPP [DATE].

 Patient had prophylactic cerclage in this pregnancy.  She
 does not have symptoms of preterm labor.
Recommendations

 -BPP or NST next week at our office.
 -Patient has weekly BPP scheduled at your office from
 07/05/2021.
 -Fetal growth assessment in 4 weeks.
                 [place], [place]

## 2023-07-31 ENCOUNTER — Encounter (HOSPITAL_COMMUNITY): Payer: Self-pay

## 2023-07-31 ENCOUNTER — Ambulatory Visit (HOSPITAL_COMMUNITY): Payer: Medicaid Other | Admitting: Student

## 2023-08-14 ENCOUNTER — Ambulatory Visit (HOSPITAL_COMMUNITY): Payer: Medicaid Other | Admitting: Licensed Clinical Social Worker

## 2023-08-14 DIAGNOSIS — F411 Generalized anxiety disorder: Secondary | ICD-10-CM | POA: Diagnosis not present

## 2023-08-14 DIAGNOSIS — F900 Attention-deficit hyperactivity disorder, predominantly inattentive type: Secondary | ICD-10-CM

## 2023-08-14 NOTE — Progress Notes (Signed)
THERAPIST PROGRESS NOTE  Virtual Visit via Video Note  I connected with Ruth Ellis on 08/14/23 at  4:00 PM EDT by a video enabled telemedicine application and verified that I am speaking with the correct person using two identifiers.  Location: Patient: Ruth Ellis  Provider: Providers Home    I discussed the limitations of evaluation and management by telemedicine and the availability of in person appointments. The patient expressed understanding and agreed to proceed.   I discussed the assessment and treatment plan with the patient. The patient was provided an opportunity to ask questions and all were answered. The patient agreed with the plan and demonstrated an understanding of the instructions.   The patient was advised to call back or seek an in-person evaluation if the symptoms worsen or if the condition fails to improve as anticipated.  I provided 30 minutes of non-face-to-face time during this encounter.   Weber Cooks, LCSW   Participation Level: Active  Behavioral Response: CasualAlertAnxious and Depressed  Type of Therapy: Individual Therapy  Treatment Goals addressed:  Active     Anxiety     LTG: Casee will score less than 5 on the Generalized Anxiety Disorder 7 Scale (GAD-7)      Start:  06/09/23    Expected End:  11/14/23         STG: Tilda Burrow will attend at least 80% of scheduled group psychotherapy sessions  (Progressing)     Start:  06/09/23    Expected End:  11/14/23         3 triggers for anxiety  (Progressing)     Start:  06/09/23    Expected End:  11/14/23       Goal Note     Financials Communication with spouse           3 coping skills for anxiety  (Progressing)     Start:  06/09/23    Expected End:  11/14/23         Discuss risks and benefits of medication treatment options for this problem and prescribe as indicated     Start:  06/09/23         Encourage Ivelise to take psychotropic medication(s) as  prescribed     Start:  06/09/23         Review results of GAD-7 with Fidelis to track progress     Start:  06/09/23         Work with Tilda Burrow to track symptoms, triggers, and/or skill use through a mood chart, diary card, or journal     Start:  06/09/23         Perform psychoeducation regarding anxiety disorders     Start:  06/09/23         Provide Chelsae with educational information and reading material on anxiety, its causes, and symptoms.      Start:  06/09/23         Work with patient individually to identify the major components of a recent episode of anxiety: physical symptoms, major thoughts and images, and major behaviors they experienced     Start:  06/09/23         Work with Tilda Burrow to identify 3 personal goals for managing their anxiety to work on during current treatment.      Start:  06/09/23           ProgressTowards Goals: Progressing  Interventions: CBT and Motivational Interviewing  Suicidal/Homicidal: Nowithout intent/plan  Therapist Response:   Pt was  alert and oriented x 5. She was dressed casually and engaged well in therapy session. She presented with anxious mood/affect. Pt was please t and cooperative in session.  Pt reports that things have been going well. She has started to budget with her spouse. Once she did start budget pt reports she had extra money as she was able to see where expenses were going. Pt does report that there was a small set back because her spouse maybe has lost a load, he was transporting today due to a minor accident. Pt reports that if he does not get paid this could be a set back for them but not impossible to overcome. She reports that she is attempting to focus on what she can control vs what she cannot.  Interventions/Plan: LCSW used psychoanalytic therapy for pt to express thoughts, feeling and emotions in session. LCSW used supportive therapy for praise and encouragement. LCSW educated pt on how organization  and budgeting can decrease anxiety.    Plan: Return again in 4 weeks.  Diagnosis: Generalized anxiety disorder  Attention deficit hyperactivity disorder (ADHD), predominantly inattentive type  Collaboration of Care: Other None today   Patient/Guardian was advised Release of Information must be obtained prior to any record release in order to collaborate their care with an outside provider. Patient/Guardian was advised if they have not already done so to contact the registration department to sign all necessary forms in order for [place] to release information regarding their care.   Consent: Patient/Guardian gives verbal consent for treatment and assignment of benefits for services provided during this visit. Patient/Guardian expressed understanding and agreed to proceed.   Weber Cooks, LCSW 08/14/2023

## 2023-09-08 ENCOUNTER — Ambulatory Visit (HOSPITAL_COMMUNITY): Payer: Medicaid Other | Admitting: Licensed Clinical Social Worker

## 2023-09-11 ENCOUNTER — Ambulatory Visit (HOSPITAL_COMMUNITY): Payer: Medicaid Other | Admitting: Student

## 2023-10-06 ENCOUNTER — Ambulatory Visit (HOSPITAL_COMMUNITY): Payer: Medicaid Other | Admitting: Licensed Clinical Social Worker
